# Patient Record
Sex: Female | Born: 1980 | Hispanic: No | Smoking: Current every day smoker
Health system: Southern US, Community
[De-identification: ages and names within clinical notes are randomized; demographics above are authoritative.]

## PROBLEM LIST (undated history)

## (undated) DIAGNOSIS — Z9189 Other specified personal risk factors, not elsewhere classified: Secondary | ICD-10-CM

## (undated) DIAGNOSIS — R51 Headache: Secondary | ICD-10-CM

## (undated) DIAGNOSIS — Q348 Other specified congenital malformations of respiratory system: Secondary | ICD-10-CM

## (undated) DIAGNOSIS — R519 Headache, unspecified: Secondary | ICD-10-CM

## (undated) DIAGNOSIS — K589 Irritable bowel syndrome without diarrhea: Secondary | ICD-10-CM

## (undated) DIAGNOSIS — N83209 Unspecified ovarian cyst, unspecified side: Secondary | ICD-10-CM

## (undated) DIAGNOSIS — N390 Urinary tract infection, site not specified: Secondary | ICD-10-CM

## (undated) DIAGNOSIS — K219 Gastro-esophageal reflux disease without esophagitis: Secondary | ICD-10-CM

## (undated) DIAGNOSIS — F329 Major depressive disorder, single episode, unspecified: Secondary | ICD-10-CM

## (undated) DIAGNOSIS — F32A Depression, unspecified: Secondary | ICD-10-CM

## (undated) DIAGNOSIS — B019 Varicella without complication: Secondary | ICD-10-CM

## (undated) DIAGNOSIS — I471 Supraventricular tachycardia, unspecified: Secondary | ICD-10-CM

## (undated) DIAGNOSIS — F509 Eating disorder, unspecified: Secondary | ICD-10-CM

## (undated) HISTORY — DX: Irritable bowel syndrome, unspecified: K58.9

## (undated) HISTORY — DX: Headache, unspecified: R51.9

## (undated) HISTORY — DX: Gastro-esophageal reflux disease without esophagitis: K21.9

## (undated) HISTORY — DX: Varicella without complication: B01.9

## (undated) HISTORY — DX: Depression, unspecified: F32.A

## (undated) HISTORY — DX: Major depressive disorder, single episode, unspecified: F32.9

## (undated) HISTORY — DX: Other specified personal risk factors, not elsewhere classified: Z91.89

## (undated) HISTORY — DX: Headache: R51

## (undated) HISTORY — DX: Other specified congenital malformations of respiratory system: Q34.8

## (undated) HISTORY — DX: Urinary tract infection, site not specified: N39.0

## (undated) HISTORY — DX: Unspecified ovarian cyst, unspecified side: N83.209

## (undated) HISTORY — DX: Eating disorder, unspecified: F50.9

---
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
## 2001-02-01 ENCOUNTER — Other Ambulatory Visit: Admission: RE | Admit: 2001-02-01 | Discharge: 2001-02-01 | Payer: Self-pay | Admitting: Obstetrics and Gynecology

## 2001-08-21 ENCOUNTER — Inpatient Hospital Stay (HOSPITAL_COMMUNITY): Admission: AD | Admit: 2001-08-21 | Discharge: 2001-08-23 | Payer: Self-pay | Admitting: Obstetrics & Gynecology

## 2002-12-31 ENCOUNTER — Emergency Department (HOSPITAL_COMMUNITY): Admission: EM | Admit: 2002-12-31 | Discharge: 2002-12-31 | Payer: Self-pay | Admitting: Emergency Medicine

## 2004-03-07 ENCOUNTER — Emergency Department (HOSPITAL_COMMUNITY): Admission: EM | Admit: 2004-03-07 | Discharge: 2004-03-08 | Payer: Self-pay | Admitting: Emergency Medicine

## 2004-04-18 ENCOUNTER — Emergency Department (HOSPITAL_COMMUNITY): Admission: EM | Admit: 2004-04-18 | Discharge: 2004-04-18 | Payer: Self-pay | Admitting: Emergency Medicine

## 2004-04-22 ENCOUNTER — Ambulatory Visit (HOSPITAL_COMMUNITY): Admission: RE | Admit: 2004-04-22 | Discharge: 2004-04-22 | Payer: Self-pay | Admitting: Obstetrics & Gynecology

## 2004-07-14 ENCOUNTER — Ambulatory Visit (HOSPITAL_COMMUNITY): Admission: RE | Admit: 2004-07-14 | Discharge: 2004-07-14 | Payer: Self-pay | Admitting: Obstetrics & Gynecology

## 2004-07-16 ENCOUNTER — Inpatient Hospital Stay (HOSPITAL_COMMUNITY): Admission: AD | Admit: 2004-07-16 | Discharge: 2004-07-20 | Payer: Self-pay | Admitting: Obstetrics & Gynecology

## 2004-09-11 ENCOUNTER — Inpatient Hospital Stay (HOSPITAL_COMMUNITY): Admission: AD | Admit: 2004-09-11 | Discharge: 2004-09-11 | Payer: Self-pay | Admitting: Obstetrics & Gynecology

## 2004-09-17 ENCOUNTER — Inpatient Hospital Stay (HOSPITAL_COMMUNITY): Admission: AD | Admit: 2004-09-17 | Discharge: 2004-09-19 | Payer: Self-pay | Admitting: Obstetrics

## 2006-08-07 ENCOUNTER — Emergency Department (HOSPITAL_COMMUNITY): Admission: EM | Admit: 2006-08-07 | Discharge: 2006-08-07 | Payer: Self-pay | Admitting: Emergency Medicine

## 2007-03-03 ENCOUNTER — Inpatient Hospital Stay (HOSPITAL_COMMUNITY): Admission: AD | Admit: 2007-03-03 | Discharge: 2007-03-09 | Payer: Self-pay | Admitting: *Deleted

## 2007-03-03 ENCOUNTER — Other Ambulatory Visit: Payer: Self-pay

## 2007-03-03 ENCOUNTER — Ambulatory Visit: Payer: Self-pay | Admitting: *Deleted

## 2007-11-24 ENCOUNTER — Encounter: Admission: RE | Admit: 2007-11-24 | Discharge: 2008-01-17 | Payer: Self-pay | Admitting: Urology

## 2008-06-03 ENCOUNTER — Emergency Department (HOSPITAL_COMMUNITY): Admission: EM | Admit: 2008-06-03 | Discharge: 2008-06-03 | Payer: Self-pay | Admitting: Emergency Medicine

## 2009-02-05 ENCOUNTER — Emergency Department (HOSPITAL_COMMUNITY): Admission: EM | Admit: 2009-02-05 | Discharge: 2009-02-05 | Payer: Self-pay | Admitting: Emergency Medicine

## 2009-03-27 ENCOUNTER — Emergency Department (HOSPITAL_COMMUNITY): Admission: EM | Admit: 2009-03-27 | Discharge: 2009-03-28 | Payer: Self-pay | Admitting: Emergency Medicine

## 2010-04-13 LAB — URINALYSIS, ROUTINE W REFLEX MICROSCOPIC
Glucose, UA: NEGATIVE mg/dL
Nitrite: POSITIVE — AB
Specific Gravity, Urine: 1.017 (ref 1.005–1.030)

## 2010-04-13 LAB — URINE CULTURE

## 2010-04-13 LAB — URINE MICROSCOPIC-ADD ON

## 2010-04-20 LAB — COMPREHENSIVE METABOLIC PANEL
AST: 29 U/L (ref 0–37)
Albumin: 4.2 g/dL (ref 3.5–5.2)
Calcium: 8.6 mg/dL (ref 8.4–10.5)
Chloride: 103 mEq/L (ref 96–112)
Total Bilirubin: 0.9 mg/dL (ref 0.3–1.2)

## 2010-04-20 LAB — CBC
HCT: 45.3 % (ref 36.0–46.0)
Hemoglobin: 14.9 g/dL (ref 12.0–15.0)
MCV: 95.3 fL (ref 78.0–100.0)
RBC: 4.75 MIL/uL (ref 3.87–5.11)
RDW: 13.1 % (ref 11.5–15.5)

## 2010-04-20 LAB — DIFFERENTIAL
Lymphocytes Relative: 19 % (ref 12–46)
Neutro Abs: 11.9 10*3/uL — ABNORMAL HIGH (ref 1.7–7.7)
Neutrophils Relative %: 76 % (ref 43–77)

## 2010-04-20 LAB — URINALYSIS, ROUTINE W REFLEX MICROSCOPIC
Hgb urine dipstick: NEGATIVE
Ketones, ur: NEGATIVE mg/dL
Urobilinogen, UA: 1 mg/dL (ref 0.0–1.0)
pH: 6.5 (ref 5.0–8.0)

## 2010-04-20 LAB — LIPASE, BLOOD: Lipase: 42 U/L (ref 11–59)

## 2010-05-06 LAB — CBC
Hemoglobin: 14.3 g/dL (ref 12.0–15.0)
MCHC: 34 g/dL (ref 30.0–36.0)
MCV: 94.3 fL (ref 78.0–100.0)
RBC: 4.44 MIL/uL (ref 3.87–5.11)
RDW: 13.5 % (ref 11.5–15.5)

## 2010-05-06 LAB — URINALYSIS, ROUTINE W REFLEX MICROSCOPIC
Glucose, UA: NEGATIVE mg/dL
Nitrite: NEGATIVE
Protein, ur: NEGATIVE mg/dL
Urobilinogen, UA: 0.2 mg/dL (ref 0.0–1.0)
pH: 7.5 (ref 5.0–8.0)

## 2010-05-06 LAB — POCT I-STAT, CHEM 8
Chloride: 107 mEq/L (ref 96–112)
HCT: 39 % (ref 36.0–46.0)
Sodium: 142 mEq/L (ref 135–145)

## 2010-05-06 LAB — DIFFERENTIAL
Basophils Absolute: 0 10*3/uL (ref 0.0–0.1)
Eosinophils Absolute: 0.2 10*3/uL (ref 0.0–0.7)
Eosinophils Relative: 2 % (ref 0–5)
Lymphocytes Relative: 39 % (ref 12–46)
Monocytes Relative: 8 % (ref 3–12)
Neutrophils Relative %: 51 % (ref 43–77)

## 2010-05-06 LAB — PREGNANCY, URINE: Preg Test, Ur: NEGATIVE

## 2010-06-10 NOTE — H&P (Signed)
[name], [name]                 ACCOUNT NO.:  [id number]   MEDICAL RECORD NO.:  [id number]          PATIENT TYPE:  IPS   LOCATION:  0305                          FACILITY:  [facility]   PHYSICIAN:  Jasmine Pang, M.D. DATE OF BIRTH:  [birth year]   DATE OF ADMISSION:  03/03/2007  DATE OF DISCHARGE:                       PSYCHIATRIC ADMISSION ASSESSMENT   A 30 year old female voluntarily admitted on March 03, 2007.   HISTORY OF PRESENT ILLNESS:  The patient presents with a history  depression and suicidal thoughts, states they were fleeting but intense,  experiencing tight sensations, felt like her life was leaving her.  She  was feeling very angry and crying, wanted to disappear.  She states  that her kids were not making her happy anymore and that became very  upsetting to her.  She called her psychologist who told that she was at  her breaking point and she encouraged her to be assessed for admission.  The patient states her stressors are that she lost her job in January.  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  Her husband is unable to get  disability for a post concussive syndrome.  She has been having trouble  sleeping during the night and has changes in her appetite.   PAST PSYCHIATRIC HISTORY:  First admission to [facility].  Has been on Prozac.  Was also on Paxil in the past but reports problems  with increased anxiety.  Was hospitalized as a child and was told at  that time she was bipolar and borderline personality.   SOCIAL HISTORY:  This is a 30 year old female.  She is married, has  three children ages 71, 65 and 2.  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  She reports increasing stress at that job.   FAMILY HISTORY:  Her sister with anxiety.  Her brother's on Zoloft.  Her  father has alcohol issues on his side of the family.   ALCOHOL AND DRUG HISTORY:  The patient has been doing some drinking at  times, one to two drinks, none  currently, and smokes marijuana.  States  she either drinks alcohol or smokes marijuana to help her cope.   PRIMARY CARE Malaquias Lenker:  Is Dr[facility] Jeannetta Nap at [facility].   MEDICAL PROBLEMS:  Was recently started on medications for IBS.  History  of interstitial cystitis and supraventricular tachycardia.  She reports  that she again recently got prescriptions that are at the pharmacy but  she has not picked them up yet and we will call [facility]  [place]   MEDICATIONS:  Has been on pindolol 10 mg, Prozac 30 daily.   DRUG ALLERGIES:  CLINDAMYCIN.   PHYSICAL EXAMINATION:  The patient was fully assessed at [facility]  emergency department.  Temperature is 96.7, 76 heart rate, 18  respirations, blood pressure is 107/72, 5 feet 2.5 inches tall, 113  pounds.  Urine drug screen was positive for THC.  Urine pregnancy test  is negative.  Alcohol level 5.  CMET within normal limits.  Wbc count is  12.5.   MENTAL STATUS EXAMINATION:  She is a fully alert, cooperative female,  good eye contact.  She is casually dressed.  Speech is clear, normal  pace and tone, articulate.  Mood is depressed.  The patient's affect she  appears sad, gets teary-eyed at times.  Thought processes are coherent.  No evidence of any delusional thinking.  Endorsing suicidal thoughts.  Cognitive function intact.  Her memory is good.  Judgment and insight is  fair.  She appears sincere.   AXIS I:  Mood disorder not otherwise specified, tetrahydrocannabinol  abuse.  AXIS II:  Deferred.  AXIS III:  Irritable bowel syndrome, interstitial cystitis and  supraventricular tachycardia.  AXIS IV:  Problems with occupation, psychosocial problems and possible  economic issues.  AXIS V:  Current is 35.   Plan to contract for safety.  Stabilize her mood and thinking.  Will  continue with her Prozac and add Depakote at bedtime for mood  stabilization.  We will also contact [facility] to  clarify  her medications and resume those.  Case manager will contact her husband  for a possible family session.  The patient will continue with her  therapy appointments.  Tentative length of stay is 3-5 days.      Landry Corporal, N.P.      Jasmine Pang, M.D.  Electronically Signed    JO/MEDQ  D:  03/04/2007  T:  03/05/2007  Job:  [id number]

## 2010-06-13 NOTE — Discharge Summary (Signed)
NAMEDEJANEE, [name]                 ACCOUNT NO.:  [id number]   MEDICAL RECORD NO.:  [id number]          PATIENT TYPE:  INP   LOCATION:  9155                          FACILITY:  [facility]   PHYSICIAN:  Charles A. Clearance Coots, M.D.DATE OF BIRTH:  02-Feb-1980   DATE OF ADMISSION:  07/16/2004  DATE OF DISCHARGE:  07/20/2004                                 DISCHARGE SUMMARY   ADMISSION DIAGNOSES:  1.  At [redacted] weeks gestation.  2.  Abdominal trauma from fall.  3.  Abdominal pain.   DISCHARGE DIAGNOSES:  1.  At [redacted] weeks gestation.  2.  Abdominal trauma from fall.  3.  Abdominal pain.  4.  Much improved after bed rest.  5.  No evidence of placental abruption.   REASON FOR ADMISSION:  A 30 year old white female G4, P2, estimated date of  confinement September 18, 2004, who presents to [facility] triage after  falling on the stairs at home and striking abdomen directly.  Complains of  abdominal pain.  The patient states that she was walking up the stairs and  tripped and hit her abdomen directly in the midline and had significant  abdominal soreness and pain after falling.  Denies vaginal bleeding.   PAST MEDICAL HISTORY:  Supraventricular tachycardia.   PAST SURGICAL HISTORY:  None.   MEDICATIONS:  1.  Penadol 5 mg daily.  2.  Prenatal vitamins.   ALLERGIES:  No known drug allergies.   SOCIAL HISTORY:  Married, negative history of alcohol or recreational drug  use.  Did have history of tobacco use, but states she quit when she found  out she was pregnant.   PHYSICAL EXAMINATION:  GENERAL APPEARANCE:  Well-developed, well-nourished  female in no acute distress.  VITAL SIGNS:  temperature 98.3, pulse 121, respirations 20, blood pressure  124/66.  LUNGS:  Clear to auscultation bilaterally.  HEART:  Regular rate and rhythm.  ABDOMEN:  Soft, gravid.  No focal tenderness.  Pelvic exam was omitted.  External fetal monitor is reassuring.  No contractions.   LABORATORY DATA:  Kleihauer Betke  was positive for fetal cells.  Platelets  were 92,000.  D. dimer was elevated at 1.18.  Fibrinogen was 323 mg/dL.  Hemoglobin 12.4.   HOSPITAL COURSE:  The patient was admitted and put on bed rest.  Repeat CBC  revealed platelets of 150,000.  Pain improved after 24 hours of bed rest and  continued to decrease.  By hospital day #3, she had no significant pain and  was discharged home on hospital day #5, much improved.   DISCHARGE MEDICATIONS:  Continue prenatal vitamins.   DISPOSITION:  The patient is to rest as much as possible at  home, and is to  be followed up in the office in one week.       CAH/MEDQ  D:  07/20/2004  T:  07/21/2004  Job:  [id number]
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
## 2010-10-17 LAB — DIFFERENTIAL
Basophils Absolute: 0
Basophils Relative: 0
Eosinophils Relative: 2
Lymphocytes Relative: 34
Monocytes Relative: 6
Neutro Abs: 7.4

## 2010-10-17 LAB — URINALYSIS, ROUTINE W REFLEX MICROSCOPIC
Bilirubin Urine: NEGATIVE
Ketones, ur: NEGATIVE
Protein, ur: 30 — AB
Urobilinogen, UA: 0.2

## 2010-10-17 LAB — CBC
HCT: 40.4
MCHC: 34.5
MCV: 90.8
Platelets: 217

## 2010-10-17 LAB — URINE MICROSCOPIC-ADD ON

## 2010-10-17 LAB — RAPID URINE DRUG SCREEN, HOSP PERFORMED
Opiates: NOT DETECTED
Tetrahydrocannabinol: POSITIVE — AB

## 2010-10-17 LAB — COMPREHENSIVE METABOLIC PANEL
ALT: 13
CO2: 27
Chloride: 104
GFR calc Af Amer: >60
Glucose, Bld: 75
Sodium: 139

## 2010-10-17 LAB — PREGNANCY, URINE: Preg Test, Ur: NEGATIVE

## 2014-07-18 ENCOUNTER — Emergency Department (HOSPITAL_COMMUNITY)
Admission: EM | Admit: 2014-07-18 | Discharge: 2014-07-19 | Disposition: A | Discharge status: Home / Self Care | Payer: Self-pay | Attending: Emergency Medicine | Admitting: Emergency Medicine

## 2014-07-18 ENCOUNTER — Encounter (HOSPITAL_COMMUNITY): Payer: Self-pay | Admitting: *Deleted

## 2014-07-18 ENCOUNTER — Emergency Department (HOSPITAL_COMMUNITY): Payer: Self-pay

## 2014-07-18 DIAGNOSIS — J029 Acute pharyngitis, unspecified: Secondary | ICD-10-CM | POA: Insufficient documentation

## 2014-07-18 DIAGNOSIS — E860 Dehydration: Secondary | ICD-10-CM | POA: Insufficient documentation

## 2014-07-18 DIAGNOSIS — K529 Noninfective gastroenteritis and colitis, unspecified: Secondary | ICD-10-CM | POA: Insufficient documentation

## 2014-07-18 DIAGNOSIS — Z8679 Personal history of other diseases of the circulatory system: Secondary | ICD-10-CM | POA: Insufficient documentation

## 2014-07-18 DIAGNOSIS — Z720 Tobacco use: Secondary | ICD-10-CM | POA: Insufficient documentation

## 2014-07-18 DIAGNOSIS — R109 Unspecified abdominal pain: Secondary | ICD-10-CM | POA: Insufficient documentation

## 2014-07-18 DIAGNOSIS — Z3202 Encounter for pregnancy test, result negative: Secondary | ICD-10-CM | POA: Insufficient documentation

## 2014-07-18 HISTORY — DX: Supraventricular tachycardia, unspecified: I47.10

## 2014-07-18 HISTORY — DX: Supraventricular tachycardia: I47.1

## 2014-07-18 LAB — URINALYSIS, ROUTINE W REFLEX MICROSCOPIC
Bilirubin Urine: NEGATIVE
Glucose, UA: NEGATIVE mg/dL
Hgb urine dipstick: NEGATIVE
Ketones, ur: NEGATIVE mg/dL
Leukocytes, UA: NEGATIVE
Nitrite: NEGATIVE
Protein, ur: NEGATIVE mg/dL
Specific Gravity, Urine: 1.015 (ref 1.005–1.030)
Urobilinogen, UA: 0.2 mg/dL (ref 0.0–1.0)
pH: 5.5 (ref 5.0–8.0)

## 2014-07-18 LAB — CBC WITH DIFFERENTIAL/PLATELET
BASOS ABS: 0 10*3/uL (ref 0.0–0.1)
BASOS PCT: 0 % (ref 0–1)
EOS PCT: 1 % (ref 0–5)
Eosinophils Absolute: 0.1 10*3/uL (ref 0.0–0.7)
HEMATOCRIT: 37.6 % (ref 36.0–46.0)
Hemoglobin: 12.9 g/dL (ref 12.0–15.0)
Lymphocytes Relative: 37 % (ref 12–46)
Lymphs Abs: 4 10*3/uL (ref 0.7–4.0)
MCH: 33.4 pg (ref 26.0–34.0)
MCHC: 34.3 g/dL (ref 30.0–36.0)
MCV: 97.4 fL (ref 78.0–100.0)
Monocytes Absolute: 0.6 10*3/uL (ref 0.1–1.0)
Monocytes Relative: 5 % (ref 3–12)
Neutro Abs: 6.2 10*3/uL (ref 1.7–7.7)
Neutrophils Relative %: 57 % (ref 43–77)
Platelets: 178 10*3/uL (ref 150–400)
RBC: 3.86 MIL/uL — ABNORMAL LOW (ref 3.87–5.11)
RDW: 12.8 % (ref 11.5–15.5)
WBC: 10.9 10*3/uL — ABNORMAL HIGH (ref 4.0–10.5)

## 2014-07-18 LAB — CK: Total CK: 114 U/L (ref 38–234)

## 2014-07-18 LAB — COMPREHENSIVE METABOLIC PANEL
ALK PHOS: 39 U/L (ref 38–126)
ALT: 22 U/L (ref 14–54)
AST: 30 U/L (ref 15–41)
Albumin: 3.7 g/dL (ref 3.5–5.0)
Anion gap: 8 (ref 5–15)
BILIRUBIN TOTAL: 0.5 mg/dL (ref 0.3–1.2)
BUN: <5 mg/dL — ABNORMAL LOW (ref 6–20)
CHLORIDE: 105 mmol/L (ref 101–111)
CO2: 25 mmol/L (ref 22–32)
CREATININE: 0.71 mg/dL (ref 0.44–1.00)
Calcium: 8.5 mg/dL — ABNORMAL LOW (ref 8.9–10.3)
GFR calc Af Amer: >60 mL/min (ref >60)
Glucose, Bld: 100 mg/dL — ABNORMAL HIGH (ref 65–99)
Potassium: 4.3 mmol/L (ref 3.5–5.1)
Sodium: 138 mmol/L (ref 135–145)
Total Protein: 6.5 g/dL (ref 6.5–8.1)

## 2014-07-18 LAB — LIPASE, BLOOD: LIPASE: 23 U/L (ref 22–51)

## 2014-07-18 LAB — POC URINE PREG, ED: Preg Test, Ur: NEGATIVE

## 2014-07-18 LAB — RAPID STREP SCREEN (MED CTR MEBANE ONLY): Streptococcus, Group A Screen (Direct): NEGATIVE

## 2014-07-18 MED ORDER — PROMETHAZINE HCL 25 MG/ML IJ SOLN
25.0000 mg | Freq: Once | INTRAMUSCULAR | Status: AC
  Administered 2014-07-18: 25 mg via INTRAVENOUS
  Filled 2014-07-18: qty 1

## 2014-07-18 MED ORDER — SODIUM CHLORIDE 0.9 % IV BOLUS (SEPSIS)
1000.0000 mL | Freq: Once | INTRAVENOUS | Status: AC
  Administered 2014-07-18: 1000 mL via INTRAVENOUS

## 2014-07-18 MED ORDER — IOHEXOL 300 MG/ML  SOLN
80.0000 mL | Freq: Once | INTRAMUSCULAR | Status: AC | PRN
  Administered 2014-07-18: 80 mL via INTRAVENOUS

## 2014-07-18 MED ORDER — MORPHINE SULFATE 4 MG/ML IJ SOLN
4.0000 mg | Freq: Once | INTRAMUSCULAR | Status: AC
  Administered 2014-07-18: 4 mg via INTRAVENOUS
  Filled 2014-07-18: qty 1

## 2014-07-18 MED ORDER — KETOROLAC TROMETHAMINE 30 MG/ML IJ SOLN
30.0000 mg | Freq: Once | INTRAMUSCULAR | Status: AC
  Administered 2014-07-18: 30 mg via INTRAVENOUS
  Filled 2014-07-18: qty 1

## 2014-07-18 MED ORDER — SODIUM CHLORIDE 0.9 % IV BOLUS (SEPSIS): 1000.0000 mL | Freq: Once | INTRAVENOUS | Status: DC

## 2014-07-18 NOTE — ED Notes (Signed)
Pt in c/o vomiting since Monday, also fatigue and muscle cramps, also hot and cold chills, pt alert and oriented, no distress noted
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
## 2014-07-18 NOTE — ED Notes (Signed)
Patient transported to CT 

## 2014-07-19 LAB — PREGNANCY, URINE: Preg Test, Ur: NEGATIVE

## 2014-07-19 MED ORDER — PROMETHAZINE HCL 25 MG PO TABS
25.0000 mg | ORAL_TABLET | Freq: Three times a day (TID) | ORAL | Status: DC | PRN
Start: 2014-07-19 — End: 2014-09-26

## 2014-07-19 MED ORDER — IBUPROFEN 800 MG PO TABS: 800.0000 mg | ORAL_TABLET | Freq: Three times a day (TID) | ORAL | Status: DC | PRN

## 2014-07-19 NOTE — ED Provider Notes (Signed)
CSN: [id number]     Arrival date & time 07/18/14  1846 History   First MD Initiated Contact with Patient 07/18/14 1923     Chief Complaint  Patient presents with  . Emesis     (Consider location/radiation/quality/duration/timing/severity/associated sxs/prior Treatment) HPI Patient presents to the emergency department with multiple complaints.  The patient states that starting last Thursday she has had fatigue, body aches, chills, vomiting and diarrhea.  The patient states that she has also had some mild sore throat.  The patient denies chest pain, shortness of breath, back pain, neck pain, fever, cough, runny nose, dysuria, hematuria, hematemesis, bloody stool, lightheadedness or syncope.  Patient states that she will generally dehydrated and she has been working outside multiple days.  The patient states nothing seems make her condition better or worse.  Patient did not take any medication prior to arrival Past Medical History  Diagnosis Date  . SVT (supraventricular tachycardia)    History reviewed. No pertinent past surgical history. History reviewed. No pertinent family history. History  Substance Use Topics  . Smoking status: Current Every Day Smoker  . Smokeless tobacco: Not on file  . Alcohol Use: Not on file   OB History    No data available     Review of Systems All other systems negative except as documented in the HPI. All pertinent positives and negatives as reviewed in the HPI.   Allergies  Clindamycin  Home Medications   Prior to Admission medications   Not on File   BP 113/72 mmHg  Pulse 88  Temp(Src) 98.4 F (36.9 C) (Oral)  Resp 12  SpO2 99% Physical Exam  Constitutional: She is oriented to person, place, and time. She appears well-developed and well-nourished. No distress.  HENT:  Head: Normocephalic and atraumatic.  Mouth/Throat: Oropharynx is clear and moist.  Eyes: Pupils are equal, round, and reactive to light.  Neck: Normal range of motion.  Neck supple.  Cardiovascular: Normal rate, regular rhythm and normal heart sounds.   Pulmonary/Chest: Effort normal and breath sounds normal. No respiratory distress.  Abdominal: Soft. Bowel sounds are normal. She exhibits no distension. There is tenderness. There is no rebound and no guarding.  Musculoskeletal: She exhibits no edema.  Neurological: She is alert and oriented to person, place, and time. She exhibits normal muscle tone. Coordination normal.  Skin: Skin is warm and dry. No rash noted. No erythema.  Psychiatric: She has a normal mood and affect. Her behavior is normal.  Nursing note and vitals reviewed.   ED Course  Procedures (including critical care time) Labs Review Labs Reviewed  CBC WITH DIFFERENTIAL/PLATELET - Abnormal; Notable for the following:    WBC 10.9 (*)    RBC 3.86 (*)    All other components within normal limits  COMPREHENSIVE METABOLIC PANEL - Abnormal; Notable for the following:    Glucose, Bld 100 (*)    BUN <5 (*)    Calcium 8.5 (*)    All other components within normal limits  RAPID STREP SCREEN (NOT AT [facility])  CULTURE, GROUP A STREP  LIPASE, BLOOD  URINALYSIS, ROUTINE W REFLEX MICROSCOPIC (NOT AT [facility])  CK  PREGNANCY, URINE  POC URINE PREG, ED    Imaging Review Ct Abdomen Pelvis W Contrast  07/18/2014   CLINICAL DATA:  Acute onset of left-sided abdominal pain, nausea, vomiting and diarrhea. Initial encounter.  EXAM: CT ABDOMEN AND PELVIS WITH CONTRAST  TECHNIQUE: Multidetector CT imaging of the abdomen and pelvis was performed using the standard  protocol following bolus administration of intravenous contrast.  CONTRAST:  80mL OMNIPAQUE IOHEXOL 300 MG/ML  SOLN  COMPARISON:  CT of the abdomen and pelvis performed 12/31/2002, and pelvic ultrasound performed 07/18/2004  FINDINGS: The visualized lung bases are clear.  The liver and spleen are unremarkable in appearance. The gallbladder is within normal limits. The pancreas and adrenal glands are  unremarkable.  The kidneys are unremarkable in appearance. There is no evidence of hydronephrosis. No renal or ureteral stones are seen. No perinephric stranding is appreciated.  No free fluid is identified. The small bowel is unremarkable in appearance. The stomach is within normal limits. No acute vascular abnormalities are seen.  The appendix is normal in caliber, without evidence of appendicitis. The colon is unremarkable in appearance.  The bladder is mildly distended and grossly unremarkable. The uterus unremarkable in appearance. An intrauterine device noted in expected position at the fundus of the uterus. The ovaries are relatively symmetric. No suspicious adnexal masses are seen. No inguinal lymphadenopathy is seen.  No acute osseous abnormalities are identified.  IMPRESSION: Unremarkable contrast-enhanced CT of the abdomen and pelvis.   Electronically Signed   By: Roanna Raider M.D.   On: 07/18/2014 23:08    The patient was given IV fluids here in the emergency department.  She is feeling better.  The patient is going to be treated for a viral gastroenteritis and dehydration.  She is told to return here as needed.  Her vital signs remained stable here in the emergency department.  The patient agrees to the plan and all questions were answered.  I did advise her to follow-up with a primary care doctor or an urgent care   Charlestine Night, PA-C 07/19/14 0013  Bethann Berkshire, MD 07/20/14 1520

## 2014-07-19 NOTE — Discharge Instructions (Signed)
Return here as needed.  Follow-up with an urgent care or primary care Dr. Jaquita Folds her fluid intake, rest as much as possible

## 2014-07-21 LAB — CULTURE, GROUP A STREP: Strep A Culture: NEGATIVE

## 2014-09-26 ENCOUNTER — Ambulatory Visit (INDEPENDENT_AMBULATORY_CARE_PROVIDER_SITE_OTHER): Payer: Self-pay | Admitting: Family Medicine

## 2014-09-26 ENCOUNTER — Ambulatory Visit (INDEPENDENT_AMBULATORY_CARE_PROVIDER_SITE_OTHER): Payer: Self-pay

## 2014-09-26 VITALS — BP 120/72 | HR 89 | Temp 99.4°F | Resp 18 | Ht 62.5 in | Wt 117.8 lb

## 2014-09-26 DIAGNOSIS — K5900 Constipation, unspecified: Secondary | ICD-10-CM

## 2014-09-26 DIAGNOSIS — N838 Other noninflammatory disorders of ovary, fallopian tube and broad ligament: Secondary | ICD-10-CM

## 2014-09-26 DIAGNOSIS — B3731 Acute candidiasis of vulva and vagina: Secondary | ICD-10-CM

## 2014-09-26 DIAGNOSIS — R1084 Generalized abdominal pain: Secondary | ICD-10-CM

## 2014-09-26 DIAGNOSIS — N949 Unspecified condition associated with female genital organs and menstrual cycle: Secondary | ICD-10-CM

## 2014-09-26 DIAGNOSIS — B373 Candidiasis of vulva and vagina: Secondary | ICD-10-CM

## 2014-09-26 DIAGNOSIS — N898 Other specified noninflammatory disorders of vagina: Secondary | ICD-10-CM

## 2014-09-26 DIAGNOSIS — R112 Nausea with vomiting, unspecified: Secondary | ICD-10-CM

## 2014-09-26 DIAGNOSIS — R102 Pelvic and perineal pain: Secondary | ICD-10-CM

## 2014-09-26 DIAGNOSIS — R197 Diarrhea, unspecified: Secondary | ICD-10-CM

## 2014-09-26 LAB — POCT UA - MICROSCOPIC ONLY
BACTERIA, U MICROSCOPIC: NEGATIVE
CRYSTALS, UR, HPF, POC: NEGATIVE
Casts, Ur, LPF, POC: NEGATIVE
EPITHELIAL CELLS, URINE PER MICROSCOPY: NEGATIVE
Mucus, UA: NEGATIVE
RBC, urine, microscopic: NEGATIVE
WBC, Ur, HPF, POC: NEGATIVE
Yeast, UA: NEGATIVE

## 2014-09-26 LAB — POCT CBC
Granulocyte percent: 55.1 %G (ref 37–80)
HCT, POC: 43.3 % (ref 37.7–47.9)
HEMOGLOBIN: 13.9 g/dL (ref 12.2–16.2)
Lymph, poc: 4.7 — AB (ref 0.6–3.4)
MCH: 31.2 pg (ref 27–31.2)
MCHC: 32.2 g/dL (ref 31.8–35.4)
MCV: 97 fL (ref 80–97)
MID (CBC): 0.8 (ref 0–0.9)
MPV: 6.2 fL (ref 0–99.8)
PLATELET COUNT, POC: 264 10*3/uL (ref 142–424)
POC Granulocyte: 6.7 (ref 2–6.9)
POC LYMPH PERCENT: 38.3 %L (ref 10–50)
POC MID %: 6.6 %M (ref 0–12)
RBC: 4.46 M/uL (ref 4.04–5.48)
RDW, POC: 14.3 %
WBC: 12.2 10*3/uL — AB (ref 4.6–10.2)

## 2014-09-26 LAB — POCT URINALYSIS DIPSTICK
Bilirubin, UA: NEGATIVE
Blood, UA: NEGATIVE
GLUCOSE UA: NEGATIVE
Ketones, UA: NEGATIVE
Leukocytes, UA: NEGATIVE
NITRITE UA: NEGATIVE
Protein, UA: NEGATIVE
Spec Grav, UA: 1.02
Urobilinogen, UA: 0.2
pH, UA: 6

## 2014-09-26 LAB — POCT WET PREP WITH KOH
KOH PREP POC: POSITIVE
RBC Wet Prep HPF POC: NEGATIVE
TRICHOMONAS UA: NEGATIVE
YEAST WET PREP PER HPF POC: POSITIVE

## 2014-09-26 LAB — IFOBT (OCCULT BLOOD): IFOBT: NEGATIVE

## 2014-09-26 LAB — POCT URINE PREGNANCY: Preg Test, Ur: NEGATIVE

## 2014-09-26 MED ORDER — FLUCONAZOLE 150 MG PO TABS: 150.0000 mg | ORAL_TABLET | Freq: Once | ORAL | Status: DC

## 2014-09-26 MED ORDER — OXYCODONE-ACETAMINOPHEN 5-325 MG PO TABS: 1.0000 | ORAL_TABLET | Freq: Three times a day (TID) | ORAL | Status: DC | PRN

## 2014-09-26 MED ORDER — PROMETHAZINE HCL 25 MG PO TABS: 25.0000 mg | ORAL_TABLET | Freq: Three times a day (TID) | ORAL | Status: DC | PRN

## 2014-09-26 NOTE — Progress Notes (Addendum)
Subjective:  This chart was scribed for Norberto Sorenson, MD by [facility], medical scribe at [facility].The patient was seen in exam room 03 and the patient's care was started at 6:43 PM.   Patient ID: [name], female    DOB: [birth year], 34 y.o.   MRN: [id number] Chief Complaint  Patient presents with  . Diarrhea    x 3 weeks   . Abdominal Pain    left side, stomach pain when eating, bright yellow   . Generalized Body Aches  . Nausea  . Contraception    IUD is in and expired 6-8 months ago. pt does not have insurance to have it removed.    HPI HPI Comments: [name] is a 34 y.o. female who presents to [facility] complaining of worsening diarrhea, nausea, dry heaving, and abdominal pain. Tried several diet changes in the past which has not helped. Taking ibuprofen, recently every 8 hours. Percocet as well. Appointment with GI September 9th.Pt has had chronic GI issues. She was seen in the ER two months ago with dehydration and viral gastro. Complained of Fatigue, body ache, vomiting, diarrhea. Neg pregnancy, and strep, normal urine that did not show any dehydration. Normal CBC, CMP, lipase, and CK. Calcium was slightly low at 8.5.   She has had the abdominal pain on the left side for 3d.  Pt would like her IUD removed. Currently using vaginal film contraceptives, does not have menstrual periods since IUD. White vaginal discharge in the past day or two. No irration. Urinary frequency concerned about dehydration. Intermittent lightheaded, weakness, dizzy spells.  Past Medical History  Diagnosis Date  . SVT (supraventricular tachycardia)    Current Outpatient Prescriptions on File Prior to Visit  Medication Sig Dispense Refill  . ibuprofen (ADVIL,MOTRIN) 800 MG tablet Take 1 tablet (800 mg total) by mouth every 8 (eight) hours as needed. 21 tablet 0  . promethazine (PHENERGAN) 25 MG tablet Take 1 tablet (25 mg total) by mouth every 8  (eight) hours as needed for nausea or vomiting. (Patient not taking: Reported on 09/26/2014) 15 tablet 0   No current facility-administered medications on file prior to visit.   Allergies  Allergen Reactions  . Clindamycin     Hives/ Swelling    Review of Systems  Gastrointestinal: Positive for nausea, abdominal pain, diarrhea and constipation.  Genitourinary: Positive for frequency and vaginal discharge.  Musculoskeletal: Positive for myalgias.  Neurological: Positive for dizziness, weakness and light-headedness.      Objective:  BP 120/72 mmHg  Pulse 89  Temp(Src) 99.4 F (37.4 C) (Oral)  Resp 18  Ht 5' 2.5" (1.588 m)  Wt 117 lb 12.8 oz (53.434 kg)  BMI 21.19 kg/m2  SpO2 99% Physical Exam  Constitutional: She is oriented to person, place, and time. She appears well-developed and well-nourished. No distress.  HENT:  Head: Normocephalic and atraumatic.  Eyes: Pupils are equal, round, and reactive to light.  Neck: Normal range of motion.  Cardiovascular: Normal rate and regular rhythm.   Pulmonary/Chest: Effort normal. No respiratory distress.  Abdominal: Normal appearance. She exhibits mass. She exhibits no distension and no pulsatile midline mass. Bowel sounds are increased. There is no hepatosplenomegaly. There is tenderness in the left lower quadrant. There is no rigidity, no rebound, no guarding, no CVA tenderness, no tenderness at McBurney's point and negative Murphy's sign. No hernia.  Genitourinary:  Normal labia minor and majora. Small skin tags likely from  prior vagina births. Vagina normal. Cervix has multiple nabothian cysts. Mild amount of yellow mucoid discharge and mild erythema around external OS. IUD strings visualized and easily removed with forceps. Uterus normal to palpation. Left adnexa with palpable ovary.  Musculoskeletal: Normal range of motion.  Lymphadenopathy:       Right: No inguinal adenopathy present.       Left: No inguinal adenopathy present.    Neurological: She is alert and oriented to person, place, and time.  Skin: Skin is warm and dry.  Psychiatric: She has a normal mood and affect. Her behavior is normal.  Nursing note and vitals reviewed.     Results for orders placed or performed in visit on 09/26/14  POCT UA - Microscopic Only  Result Value Ref Range   WBC, Ur, HPF, POC neg    RBC, urine, microscopic neg    Bacteria, U Microscopic neg    Mucus, UA neg    Epithelial cells, urine per micros neg    Crystals, Ur, HPF, POC neg    Casts, Ur, LPF, POC neg    Yeast, UA neg   POCT urinalysis dipstick  Result Value Ref Range   Color, UA yellow    Clarity, UA clear    Glucose, UA neg    Bilirubin, UA neg    Ketones, UA neg    Spec Grav, UA 1.020    Blood, UA neg    pH, UA 6.0    Protein, UA neg    Urobilinogen, UA 0.2    Nitrite, UA neg    Leukocytes, UA Negative Negative  POCT urine pregnancy  Result Value Ref Range   Preg Test, Ur Negative Negative  POCT Wet Prep with KOH  Result Value Ref Range   Trichomonas, UA Negative    Clue Cells Wet Prep HPF POC 0-4    Epithelial Wet Prep HPF POC Many Few, Moderate, Many   Yeast Wet Prep HPF POC positive    Bacteria Wet Prep HPF POC Many (A) None, Few   RBC Wet Prep HPF POC neg    WBC Wet Prep HPF POC 15-20    KOH Prep POC Positive   POCT CBC  Result Value Ref Range   WBC 12.2 (A) 4.6 - 10.2 K/uL   Lymph, poc 4.7 (A) 0.6 - 3.4   POC LYMPH PERCENT 38.3 10 - 50 %L   MID (cbc) 0.8 0 - 0.9   POC MID % 6.6 0 - 12 %M   POC Granulocyte 6.7 2 - 6.9   Granulocyte percent 55.1 37 - 80 %G   RBC 4.46 4.04 - 5.48 M/uL   Hemoglobin 13.9 12.2 - 16.2 g/dL   HCT, POC 16.1 09.6 - 47.9 %   MCV 97.0 80 - 97 fL   MCH, POC 31.2 27 - 31.2 pg   MCHC 32.2 31.8 - 35.4 g/dL   RDW, POC 04.5 %   Platelet Count, POC 264 142 - 424 K/uL   MPV 6.2 0 - 99.8 fL  IFOBT POC (occult bld, rslt in office)  Result Value Ref Range   IFOBT Negative    [facility] reading (PRIMARY) by  Dr. Clelia Croft. 2  view abd xray: no acute abnormality  Dg Abd 2 Views  09/26/2014   CLINICAL DATA:  Abdominal pain, nausea, diarrhea  EXAM: ABDOMEN - 2 VIEW  COMPARISON:  None.  FINDINGS: The bowel gas pattern is normal. There is no evidence of free air. No radio-opaque calculi or other  significant radiographic abnormality is seen.  IMPRESSION: Negative.   Electronically Signed   By: Elige Ko   On: 09/26/2014 21:07    Assessment & Plan:   1. Diarrhea   2. Vaginal discharge   3. Non-intractable vomiting with nausea, vomiting of unspecified type   4. Generalized abdominal pain   5. Ovarian enlargement, left   6. Constipation, unspecified constipation type   Pt w/ h/o chronic abd pain with alternating constipation/diarrhea. She has been clinically diagnosed with IBS sev times prior after sxs did not respond to elimination diets with either lactose or gluten but she has never seen a GI doctor or had a colonoscopy. Has appt to sched w/ new PCP in  [place] in 10d so will proceed w/ [place] [facility] referral as well.  Currently suspect ovarian cyst but cannot exclude ovarian malignancy, torsion, PID, ectopic.  Pain has been present for 3d now and has not worsened over the past 24 hrs. She was able to go to a job interview today at her current level of pain. Pain is severe but she does not have any peritoneal or concerns for an acute surgical abd at this time but offered to send pt to ER tonight for stat abd/pelvic CT - pt declines since pain is similar to prior ovarian cyst but understands that torsion which is a surgical emergency cannot be completely excluded - is aware of risks of waiting for imaging and agrees to call 911 and proceed to ER o/n if any peritoneal signs develop.  In the mean, pt will use prn percocet and phenergan to which she has responded well prev.   Also, no need to treat with IM antibitoic therapy tonight - leukocytosis and mild and diff supports it being reactive rather than true infection.  Pt does  not have any signs of cervicitis or endometritis on exam. Old IUD was removed w/o difficulty and it appeared normal - no bleeding, purulent discharge, odor, or tenderness so PID very unlikely but will check uriprobe in case.  Neg UPT and using contraception of "vaginal films" religiously so highly unlikely to be ectopic  Pt does not have health insurance - is paying for all out of pocket.   Orders Placed This Encounter  Procedures  . GC/Chlamydia Probe Amp  . DG Abd 2 Views    Standing Status: Future     Number of Occurrences: 1     Standing Expiration Date: 09/26/2015    Order Specific Question:  Reason for Exam (SYMPTOM  OR DIAGNOSIS REQUIRED)    Answer:  constipaiton/diarrhea, vomiting. bowels hyperactive, left lower quad pain    Order Specific Question:  Is the patient pregnant?    Answer:  No    Order Specific Question:  Preferred imaging location?    Answer:  External  . Ambulatory referral to Gastroenterology    Referral Priority:  Routine    Referral Type:  Consultation    Referral Reason:  Specialty Services Required    Number of Visits Requested:  1  . POCT UA - Microscopic Only  . POCT urinalysis dipstick  . POCT urine pregnancy  . POCT Wet Prep with KOH  . POCT CBC  . IFOBT POC (occult bld, rslt in office)    Meds ordered this encounter  Medications  . promethazine (PHENERGAN) 25 MG tablet    Sig: Take 1 tablet (25 mg total) by mouth every 8 (eight) hours as needed for nausea or vomiting.    Dispense:  60 tablet  Refill:  0  . oxyCODONE-acetaminophen (ROXICET) 5-325 MG per tablet    Sig: Take 1 tablet by mouth every 8 (eight) hours as needed for severe pain.    Dispense:  20 tablet    Refill:  0  . fluconazole (DIFLUCAN) 150 MG tablet    Sig: Take 1 tablet (150 mg total) by mouth once. Repeat if needed after 3 days    Dispense:  1 tablet    Refill:  1    I personally performed the services described in this documentation, which was scribed in my presence.  The recorded information has been reviewed and considered, and addended by me as needed.  Norberto Sorenson, MD MPH

## 2014-09-26 NOTE — Patient Instructions (Signed)
Ovarian Cyst An ovarian cyst is a fluid-filled sac that forms on an ovary. The ovaries are small organs that produce eggs in women. Various types of cysts can form on the ovaries. Most are not cancerous. Many do not cause problems, and they often go away on their own. Some may cause symptoms and require treatment. Common types of ovarian cysts include:  Functional cysts--These cysts may occur every month during the menstrual cycle. This is normal. The cysts usually go away with the next menstrual cycle if the woman does not get pregnant. Usually, there are no symptoms with a functional cyst.  Endometrioma cysts--These cysts form from the tissue that lines the uterus. They are also called "chocolate cysts" because they become filled with blood that turns brown. This type of cyst can cause pain in the lower abdomen during intercourse and with your menstrual period.  Cystadenoma cysts--This type develops from the cells on the outside of the ovary. These cysts can get very big and cause lower abdomen pain and pain with intercourse. This type of cyst can twist on itself, cut off its blood supply, and cause severe pain. It can also easily rupture and cause a lot of pain.  Dermoid cysts--This type of cyst is sometimes found in both ovaries. These cysts may contain different kinds of body tissue, such as skin, teeth, hair, or cartilage. They usually do not cause symptoms unless they get very big.  Theca lutein cysts--These cysts occur when too much of a certain hormone (human chorionic gonadotropin) is produced and overstimulates the ovaries to produce an egg. This is most common after procedures used to assist with the conception of a baby (in vitro fertilization). CAUSES   Fertility drugs can cause a condition in which multiple large cysts are formed on the ovaries. This is called ovarian hyperstimulation syndrome.  A condition called polycystic ovary syndrome can cause hormonal imbalances that can lead to  nonfunctional ovarian cysts. SIGNS AND SYMPTOMS  Many ovarian cysts do not cause symptoms. If symptoms are present, they may include:  Pelvic pain or pressure.  Pain in the lower abdomen.  Pain during sexual intercourse.  Increasing girth (swelling) of the abdomen.  Abnormal menstrual periods.  Increasing pain with menstrual periods.  Stopping having menstrual periods without being pregnant. DIAGNOSIS  These cysts are commonly found during a routine or annual pelvic exam. Tests may be ordered to find out more about the cyst. These tests may include:  Ultrasound.  X-ray of the pelvis.  CT scan.  MRI.  Blood tests. TREATMENT  Many ovarian cysts go away on their own without treatment. Your health care provider may want to check your cyst regularly for 2-3 months to see if it changes. For women in menopause, it is particularly important to monitor a cyst closely because of the higher rate of ovarian cancer in menopausal women. When treatment is needed, it may include any of the following:  A procedure to drain the cyst (aspiration). This may be done using a long needle and ultrasound. It can also be done through a laparoscopic procedure. This involves using a thin, lighted tube with a tiny camera on the end (laparoscope) inserted through a small incision.  Surgery to remove the whole cyst. This may be done using laparoscopic surgery or an open surgery involving a larger incision in the lower abdomen.  Hormone treatment or birth control pills. These methods are sometimes used to help dissolve a cyst. HOME CARE INSTRUCTIONS   Only take over-the-counter   or prescription medicines as directed by your health care provider.  Follow up with your health care provider as directed.  Get regular pelvic exams and Pap tests. SEEK MEDICAL CARE IF:   Your periods are late, irregular, or painful, or they stop.  Your pelvic pain or abdominal pain does not go away.  Your abdomen becomes  larger or swollen.  You have pressure on your bladder or trouble emptying your bladder completely.  You have pain during sexual intercourse.  You have feelings of fullness, pressure, or discomfort in your stomach.  You lose weight for no apparent reason.  You feel generally ill.  You become constipated.  You lose your appetite.  You develop acne.  You have an increase in body and facial hair.  You are gaining weight, without changing your exercise and eating habits.  You think you are pregnant. SEEK IMMEDIATE MEDICAL CARE IF:   You have increasing abdominal pain.  You feel sick to your stomach (nauseous), and you throw up (vomit).  You develop a fever that comes on suddenly.  You have abdominal pain during a bowel movement.  Your menstrual periods become heavier than usual. MAKE SURE YOU:  Understand these instructions.  Will watch your condition.  Will get help right away if you are not doing well or get worse. Document Released: 01/12/2005 Document Revised: 01/17/2013 Document Reviewed: 09/19/2012 [facility] Patient Information 2015 [place], [place]. This information is not intended to replace advice given to you by your health care provider. Make sure you discuss any questions you have with your health care provider. Ovarian Torsion The ovaries are female reproductive organs that produce eggs. Ovarian torsion is when an ovary becomes twisted and cuts off its own blood supply. This can occur at any age. If an ovary is twisted, it cannot get blood and the ovary swells. It is a painful medical emergency. It must be treated quickly. If too much time has passed, blood flow to the ovary may not be restored and the ovary may have to be removed. CAUSES Torsion can happen in an ovary that is normal size. However, most of the time it occurs in an ovary that is enlarged. An ovary can become enlarged because of:  Harmless (benign) tumors on the ovaries.  Cancerous  tumors.  Ovarian cysts, which are fluid-filled sacs.  Normal pregnancy.  A pregnancy that occurs outside the uterus (ectopic pregnancy). RISK FACTORS Risk factors are things that increase the likelihood of this condition happening. The risk factors include:  Having fallopian tubes that are longer than normal.  Having ovaries that are larger than normal.  Taking fertility medicine to become pregnant.  Having had surgery in the pelvic area. SYMPTOMS  Sudden pain in the lower abdomen, usually on one side only.  Pelvic pain that starts after exercise.  Pelvic pain that gets worse over time.  Severe pelvic pain that comes and goes.  Pelvic pain that spreads into the lower back or thigh.  Nausea and vomiting along with pelvic pain. DIAGNOSIS Your caregiver will take a history and perform a physical exam. He or she may be able to feel an enlarged ovary. Your caregiver may order some further tests, which include:  A pregnancy test.  Imaging tests, such as pelvic Doppler ultrasonography, that measures blood flow, CT scan, or MRI. Your caregiver may also perform a diagnostic laparoscopic exam. A small surgical cut (incision) will be made in your abdomen. Then, a small lighted telescope is put through the opening. This  allows your caregiver to clearly see your ovary and fallopian tube.  TREATMENT Surgery is needed when an ovary becomes twisted. It is best to do this 8 hours or less after the ovary becomes twisted. Laparoscopic ovarian torsion surgery is done to try to untwist the ovary. Sometimes, a large incision has to be made in the abdomen (laparotomy) to relieve the ovary. If the ovary cannot be untwisted, the ovary will have to be surgically removed during a procedure called salpingo-oophorectomy. Document Released: 01/01/2011 Document Revised: 05/29/2013 Document Reviewed: 01/01/2011 [facility] Patient Information 2015 [place], [place]. This information is not intended to replace  advice given to you by your health care provider. Make sure you discuss any questions you have with your health care provider.

## 2014-09-27 ENCOUNTER — Ambulatory Visit (HOSPITAL_COMMUNITY)
Admission: RE | Admit: 2014-09-27 | Discharge: 2014-09-27 | Disposition: A | Discharge status: Home / Self Care | Payer: Self-pay | Source: Ambulatory Visit | Attending: Family Medicine | Admitting: Family Medicine

## 2014-09-27 ENCOUNTER — Other Ambulatory Visit: Payer: Self-pay | Admitting: Family Medicine

## 2014-09-27 DIAGNOSIS — N838 Other noninflammatory disorders of ovary, fallopian tube and broad ligament: Secondary | ICD-10-CM

## 2014-09-27 DIAGNOSIS — N831 Corpus luteum cyst: Secondary | ICD-10-CM | POA: Insufficient documentation

## 2014-09-27 DIAGNOSIS — N898 Other specified noninflammatory disorders of vagina: Secondary | ICD-10-CM | POA: Insufficient documentation

## 2014-09-27 DIAGNOSIS — R1032 Left lower quadrant pain: Secondary | ICD-10-CM | POA: Insufficient documentation

## 2014-09-27 DIAGNOSIS — R1084 Generalized abdominal pain: Secondary | ICD-10-CM

## 2014-09-27 DIAGNOSIS — R102 Pelvic and perineal pain: Secondary | ICD-10-CM

## 2014-09-27 MED ORDER — DOXYCYCLINE HYCLATE 100 MG PO CAPS: 100.0000 mg | ORAL_CAPSULE | Freq: Two times a day (BID) | ORAL | Status: DC

## 2014-09-27 NOTE — Addendum Note (Signed)
Addended by: Eddie Candle on: 09/27/2014 08:53 AM   Modules accepted: Orders

## 2014-09-27 NOTE — Addendum Note (Signed)
Addended by: Norberto Sorenson on: 09/27/2014 06:33 PM   Modules accepted: Orders

## 2014-09-28 LAB — GC/CHLAMYDIA PROBE AMP
CT Probe RNA: NEGATIVE
GC PROBE AMP APTIMA: NEGATIVE

## 2014-10-05 ENCOUNTER — Encounter: Payer: Self-pay | Admitting: Family

## 2014-10-05 ENCOUNTER — Ambulatory Visit (INDEPENDENT_AMBULATORY_CARE_PROVIDER_SITE_OTHER): Payer: Self-pay | Admitting: Family

## 2014-10-05 VITALS — BP 100/72 | HR 88 | Temp 98.4°F | Resp 18 | Ht 62.5 in | Wt 118.0 lb

## 2014-10-05 DIAGNOSIS — R1032 Left lower quadrant pain: Secondary | ICD-10-CM | POA: Insufficient documentation

## 2014-10-05 MED ORDER — ELUXADOLINE 100 MG PO TABS: 100.0000 mg | ORAL_TABLET | Freq: Two times a day (BID) | ORAL | Status: DC

## 2014-10-05 NOTE — Progress Notes (Signed)
Subjective:    Patient ID: [name], female    DOB: 1980/06/02, 34 y.o.   MRN: [id number]  Chief Complaint  Patient presents with  . Establish Care    stilld does have abdominal pain that goes to her back, described as a dull constant pain with increased movement, states she had ovarian cysts that ruptured along with pelvic infection, could not finish antibiotic due to reaction, does not have GYN doc, has had severe diarrhea for a month, does have GI appt set up for 9/29    HPI:  [name] is a 34 y.o. female who  has a past medical history of SVT (supraventricular tachycardia); Depression; GERD (gastroesophageal reflux disease); Eating disorder; Frequent headaches; History of fainting spells of unknown cause; Chicken pox; UTI (lower urinary tract infection); IBS (irritable bowel syndrome); and Ovarian cyst. who presents today for an office visit to establish care.   Recently seen in [facility] for diarrhea, vaginal discharge, generalized abdominal pain, ovarian enlargement, left adnexal tenderness and vaginal candidiasis. GC probe was negative and the wet prep was positive for yeast and had many bacteria noted. A transvaginal ultrasound was also completed and showed no evidence for her left sided abdominal pain but did show a right ovarian lesion measuring 1.8 cm. She was treated with phenergan, oxycodone, fluconazole and doxycycline. She has a scheduled GI appointment on 9/29.  Continues to experience the associated symptom of pain located between the lower left quadrant and suprapubic region that has been going on total for about 7 years since she had her children. Describes the pain as dull and constant and also sharp and stabbing on occasion. Indicates it felt like "a steel rebarb going through the area." Aggravating factors include discomfort with bowel movements. Severity of the pain is is rated a 4/10. Does note that she is having loose stools and which are different  colors including orange, brown and occasionally dark. Frequency of stools is 2-7 per day depending upon the day. Modifying factors include the oxycodone and antibiotics. She did have a reaction to the antibiotic.   Allergies  Allergen Reactions  . Clindamycin     Hives/ Swelling   . Doxycycline Itching    Nausea and vomitting     Outpatient Prescriptions Prior to Visit  Medication Sig Dispense Refill  . ibuprofen (ADVIL,MOTRIN) 800 MG tablet Take 1 tablet (800 mg total) by mouth every 8 (eight) hours as needed. 21 tablet 0  . oxyCODONE-acetaminophen (ROXICET) 5-325 MG per tablet Take 1 tablet by mouth every 8 (eight) hours as needed for severe pain. 20 tablet 0  . promethazine (PHENERGAN) 25 MG tablet Take 1 tablet (25 mg total) by mouth every 8 (eight) hours as needed for nausea or vomiting. 60 tablet 0  . doxycycline (VIBRAMYCIN) 100 MG capsule Take 1 capsule (100 mg total) by mouth 2 (two) times daily. 28 capsule 0  . fluconazole (DIFLUCAN) 150 MG tablet Take 1 tablet (150 mg total) by mouth once. Repeat if needed after 3 days 1 tablet 1   No facility-administered medications prior to visit.     Past Medical History  Diagnosis Date  . SVT (supraventricular tachycardia)   . Depression   . GERD (gastroesophageal reflux disease)   . Eating disorder   . Frequent headaches   . History of fainting spells of unknown cause   . Chicken pox   . UTI (lower urinary tract infection)   . IBS (irritable bowel syndrome)   .  Ovarian cyst      No past surgical history on file.   Family History  Problem Relation Age of Onset  . Hyperlipidemia Mother   . Hypertension Mother   . Bipolar disorder Mother   . Hyperlipidemia Father   . Hypertension Father   . Arthritis Maternal Grandmother   . Arthritis Maternal Grandfather   . Arthritis Paternal Grandmother   . Arthritis Paternal Grandfather      Social History   Social History  . Marital Status: Divorced    Spouse Name: N/A  .  Number of Children: 3  . Years of Education: 14   Occupational History  . Marketing    Social History Main Topics  . Smoking status: Current Every Day Smoker -- 1.00 packs/day for 21 years    Types: Cigarettes  . Smokeless tobacco: Never Used  . Alcohol Use: 3.6 oz/week    2 Glasses of wine, 2 Cans of beer, 2 Shots of liquor per week  . Drug Use: Yes    Special: Marijuana  . Sexual Activity: Yes    Birth Control/ Protection: None, Condom   Other Topics Concern  . Not on file   Social History Narrative   Fun: Clinical cytogeneticist, work, Sports administrator   Denies religious beliefs effecting health care   Denies abuse and feels safe at home.     Review of Systems  Constitutional: Negative for fever and chills.  Gastrointestinal: Positive for abdominal pain and diarrhea. Negative for vomiting.  Genitourinary: Negative for dysuria, urgency, frequency, hematuria, flank pain and vaginal discharge.      Objective:    BP 100/72 mmHg  Pulse 88  Temp(Src) 98.4 F (36.9 C) (Oral)  Resp 18  Ht 5' 2.5" (1.588 m)  Wt 118 lb (53.524 kg)  BMI 21.22 kg/m2  SpO2 96% Nursing note and vital signs reviewed.  Physical Exam  Constitutional: She is oriented to person, place, and time. She appears well-developed and well-nourished. No distress.  Cardiovascular: Normal rate, regular rhythm, normal heart sounds and intact distal pulses.   Pulmonary/Chest: Effort normal and breath sounds normal.  Abdominal: Soft. Normal appearance and bowel sounds are normal. She exhibits no mass. There is no hepatosplenomegaly. There is tenderness in the suprapubic area and left lower quadrant. There is no rigidity, no rebound, no guarding, no tenderness at McBurney's point and negative Murphy's sign.  Neurological: She is alert and oriented to person, place, and time.  Skin: Skin is warm and dry.  Psychiatric: She has a normal mood and affect. Her behavior is normal. Judgment and thought content normal.       Assessment &  Plan:   Problem List Items Addressed This Visit      Other   Abdominal pain, left lower quadrant - Primary    Long standing diarreha and abdominal pain consistent with possible  IBS-D or IBD. Ultrasound was negative for ovarian cyst. Start Viberzi in attempts to decrease diarreha. Recommend Activia or other probiotic daily. Unlikely diverticulitis based on length of symptoms.  Follow up with GI as scheduled for possible colonoscopy and additional assessment.       Relevant Medications   Eluxadoline (VIBERZI) 100 MG TABS

## 2014-10-05 NOTE — Patient Instructions (Signed)
Thank you for choosing [facility].  Summary/Instructions:  Your prescription(s) have been submitted to your pharmacy or been printed and provided for you. Please take as directed and contact our office if you believe you are having problem(s) with the medication(s) or have any questions.  If your symptoms worsen or fail to improve, please contact our office for further instruction, or in case of emergency go directly to the emergency room at the closest medical facility.   Irritable Bowel Syndrome Irritable bowel syndrome (IBS) is caused by a disturbance of normal bowel function and is a common digestive disorder. You may also hear this condition called spastic colon, mucous colitis, and irritable colon. There is no cure for IBS. However, symptoms often gradually improve or disappear with a good diet, stress management, and medicine. This condition usually appears in late adolescence or early adulthood. Women develop it twice as often as men. CAUSES  After food has been digested and absorbed in the small intestine, waste material is moved into the large intestine, or colon. In the colon, water and salts are absorbed from the undigested products coming from the small intestine. The remaining residue, or fecal material, is held for elimination. Under normal circumstances, gentle, rhythmic contractions of the bowel walls push the fecal material along the colon toward the rectum. In IBS, however, these contractions are irregular and poorly coordinated. The fecal material is either retained too long, resulting in constipation, or expelled too soon, producing diarrhea. SIGNS AND SYMPTOMS  The most common symptom of IBS is abdominal pain. It is often in the lower left side of the abdomen, but it may occur anywhere in the abdomen. The pain comes from spasms of the bowel muscles happening too much and from the buildup of gas and fecal material in the colon. This pain:  Can range from sharp abdominal  cramps to a dull, continuous ache.  Often worsens soon after eating.  Is often relieved by having a bowel movement or passing gas. Abdominal pain is usually accompanied by constipation, but it may also produce diarrhea. The diarrhea often occurs right after a meal or upon waking up in the morning. The stools are often soft, watery, and flecked with mucus. Other symptoms of IBS include:  Bloating.  Loss of appetite.  Heartburn.  Backache.  Dull pain in the arms or shoulders.  Nausea.  Burping.  Vomiting.  Gas. IBS may also cause symptoms that are unrelated to the digestive system, such as:  Fatigue.  Headaches.  Anxiety.  Shortness of breath.  Trouble concentrating.  Dizziness. These symptoms tend to come and go. DIAGNOSIS  The symptoms of IBS may seem like symptoms of other, more serious digestive disorders. Your health care provider may want to perform tests to exclude these disorders.  TREATMENT Many medicines are available to help correct bowel function or relieve bowel spasms and abdominal pain. Among the medicines available are:  Laxatives for severe constipation and to help restore normal bowel habits.  Specific antidiarrheal medicines to treat severe or lasting diarrhea.  Antispasmodic agents to relieve intestinal cramps. Your health care provider may also decide to treat you with a mild tranquilizer or sedative during unusually stressful periods in your life. Your health care provider may also prescribe antidepressant medicine. The use of this medicine has been shown to reduce pain and other symptoms of IBS. Remember that if any medicine is prescribed for you, you should take it exactly as directed. Make sure your health care provider knows how well  it worked for you. HOME CARE INSTRUCTIONS   Take all medicines as directed by your health care provider.  Avoid foods that are high in fat or oils, such as heavy cream, butter, frankfurters, sausage, and other  fatty meats.  Avoid foods that make you go to the bathroom, such as fruit, fruit juice, and dairy products.  Cut out carbonated drinks, chewing gum, and "gassy" foods such as beans and cabbage. This may help relieve bloating and burping.  Eat foods with bran, and drink plenty of liquids with the bran foods. This helps relieve constipation.  Keep track of what foods seem to bring on your symptoms.  Avoid emotionally charged situations or circumstances that produce anxiety.  Start or continue exercising.  Get plenty of rest and sleep. Document Released: 01/12/2005 Document Revised: 01/17/2013 Document Reviewed: 09/02/2007 [facility] Patient Information 2015 [place], [place]. This information is not intended to replace advice given to you by your health care provider. Make sure you discuss any questions you have with your health care provider.  Crohn Disease Crohn disease is a long-term (chronic) soreness and redness (inflammation) of the intestines (bowel). It can affect any portion of the digestive tract, from the mouth to the anus. It can also cause problems outside the digestive tract. Crohn disease is closely related to a disease called ulcerative colitis (together, these two diseases are called inflammatory bowel disease).  CAUSES  The cause of Crohn disease is not known. One Nelva Bush is that, in an easily affected person, the immune system is triggered to attack the body's own digestive tissue. Crohn disease runs in families. It seems to be more common in certain geographic areas and amongst certain races. There are no clear-cut dietary causes.  SYMPTOMS  Crohn disease can cause many different symptoms since it can affect many different parts of the body. Symptoms include:  Fatigue.  Weight loss.  Chronic diarrhea, sometime bloody.  Abdominal pain and cramps.  Fever.  Ulcers or canker sores in the mouth or rectum.  Anemia (low red blood cells).  Arthritis, skin problems, and eye  problems may occur. Complications of Crohn disease can include:  Series of holes (perforation) of the bowel.  Portions of the intestines sticking to each other (adhesions).  Obstruction of the bowel.  Fistula formation, typically in the rectal area but also in other areas. A fistula is an opening between the bowels and the outside, or between the bowels and another organ.  A painful crack in the mucous membrane of the anus (rectal fissure). DIAGNOSIS  Your caregiver may suspect Crohn disease based on your symptoms and an exam. Blood tests may confirm that there is a problem. You may be asked to submit a stool specimen for examination. X-rays and CT scans may be necessary. Ultimately, the diagnosis is usually made after a procedure that uses a flexible tube that is inserted via your mouth or your anus. This is done under sedation and is called either an upper endoscopy or colonoscopy. With these tests, the specialist can take tiny tissue samples and remove them from the inside of the bowel (biopsy). Examination of this biopsy tissue under a microscope can reveal Crohn disease as the cause of your symptoms. Due to the many different forms that Crohn disease can take, symptoms may be present for several years before a diagnosis is made. TREATMENT  Medications are often used to decrease inflammation and control the immune system. These include medicines related to aspirin, steroid medications, and newer and stronger medications  to slow down the immune system. Some medications may be used as suppositories or enemas. A number of other medications are used or have been studied. Your caregiver will make specific recommendations. HOME CARE INSTRUCTIONS   Symptoms such as diarrhea can be controlled with medications. Avoid foods that have a laxative effect such as fresh fruit, vegetables, and dairy products. During flare-ups, you can rest your bowel by refraining from solid foods. Drink clear liquids  frequently during the day. (Electrolyte or rehydrating fluids are best. Your caregiver can help you with suggestions.) Drink often to prevent loss of body fluids (dehydration). When diarrhea has cleared, eat small meals and more frequently. Avoid food additives and stimulants such as caffeine (coffee, tea, or chocolate). Enzyme supplements may help if you develop intolerance to a sugar in dairy products (lactose). Ask your caregiver or dietitian about specific dietary instructions.  Try to maintain a positive attitude. Learn relaxation techniques such as self-hypnosis, mental imaging, and muscle relaxation.  If possible, avoid stresses which can aggravate your condition.  Exercise regularly.  Follow your diet.  Always get plenty of rest. SEEK MEDICAL CARE IF:   Your symptoms fail to improve after a week or two of new treatment.  You experience continued weight loss.  You have ongoing cramps or loose bowels.  You develop a new skin rash, skin sores, or eye problems. SEEK IMMEDIATE MEDICAL CARE IF:   You have worsening of your symptoms or develop new symptoms.  You have a fever.  You develop bloody diarrhea.  You develop severe abdominal pain. MAKE SURE YOU:   Understand these instructions.  Will watch your condition.  Will get help right away if you are not doing well or get worse. Document Released: 10/22/2004 Document Revised: 05/29/2013 Document Reviewed: 09/20/2006 [facility] Patient Information 2015 [place], [place]. This information is not intended to replace advice given to you by your health care provider. Make sure you discuss any questions you have with your health care provider.

## 2014-10-05 NOTE — Assessment & Plan Note (Addendum)
Long standing diarreha and abdominal pain consistent with possible  IBS-D or IBD. Ultrasound was negative for ovarian cyst. Start Viberzi in attempts to decrease diarreha. Recommend Activia or other probiotic daily. Unlikely diverticulitis based on length of symptoms.  Follow up with GI as scheduled for possible colonoscopy and additional assessment.

## 2014-10-05 NOTE — Progress Notes (Signed)
Pre visit review using our clinic review tool, if applicable. No additional management support is needed unless otherwise documented below in the visit note. 

## 2014-10-25 ENCOUNTER — Ambulatory Visit: Payer: Self-pay | Admitting: Internal Medicine

## 2015-05-10 ENCOUNTER — Ambulatory Visit (INDEPENDENT_AMBULATORY_CARE_PROVIDER_SITE_OTHER): Payer: Self-pay | Admitting: Family Medicine

## 2015-05-10 VITALS — BP 108/76 | HR 100 | Temp 98.4°F | Resp 16 | Ht 62.5 in | Wt 133.0 lb

## 2015-05-10 DIAGNOSIS — J0100 Acute maxillary sinusitis, unspecified: Secondary | ICD-10-CM

## 2015-05-10 MED ORDER — CEFDINIR 300 MG PO CAPS: 300.0000 mg | ORAL_CAPSULE | Freq: Two times a day (BID) | ORAL | Status: DC

## 2015-05-10 MED ORDER — PREDNISONE 10 MG PO TABS: 30.0000 mg | ORAL_TABLET | Freq: Every day | ORAL | Status: DC

## 2015-05-10 MED ORDER — IPRATROPIUM BROMIDE 0.06 % NA SOLN: 2.0000 | Freq: Four times a day (QID) | NASAL | Status: DC

## 2015-05-10 NOTE — Progress Notes (Signed)
Andrea HawthorneMarisa C Hansen is a 35 y.o. female who presents to Urgent Care today for Cough congestion and sinus pain and pressure. Symptoms present for one week worsening recently. Patient has right-sided sinus pain is worse than left. She notes clear mucus production. She denies any fevers chills but does note some posttussive vomiting and a mild amount of diarrhea.she has tried multiple over-the-counter medicines which have not been very effective. She notes drug allergies to clindamycin and doxycycline. Her last menstrual period was April 1 She is not currently pregnant nor she breast-feeding.   Past Medical History  Diagnosis Date  . SVT (supraventricular tachycardia) (HCC)   . Depression   . GERD (gastroesophageal reflux disease)   . Eating disorder   . Frequent headaches   . History of fainting spells of unknown cause   . Chicken pox   . UTI (lower urinary tract infection)   . IBS (irritable bowel syndrome)   . Ovarian cyst    No past surgical history on file. Social History  Substance Use Topics  . Smoking status: Current Every Day Smoker -- 1.00 packs/day for 21 years    Types: Cigarettes  . Smokeless tobacco: Never Used  . Alcohol Use: 3.6 oz/week    2 Glasses of wine, 2 Cans of beer, 2 Shots of liquor per week   ROS as above Medications: Current Outpatient Prescriptions  Medication Sig Dispense Refill  . cefdinir (OMNICEF) 300 MG capsule Take 1 capsule (300 mg total) by mouth 2 (two) times daily. 14 capsule 0  . Eluxadoline (VIBERZI) 100 MG TABS Take 100 mg by mouth 2 (two) times daily. (Patient not taking: Reported on 05/10/2015) 60 tablet 0  . ibuprofen (ADVIL,MOTRIN) 800 MG tablet Take 1 tablet (800 mg total) by mouth every 8 (eight) hours as needed. (Patient not taking: Reported on 05/10/2015) 21 tablet 0  . ipratropium (ATROVENT) 0.06 % nasal spray Place 2 sprays into both nostrils 4 (four) times daily. 15 mL 12  . oxyCODONE-acetaminophen (ROXICET) 5-325 MG per tablet Take 1  tablet by mouth every 8 (eight) hours as needed for severe pain. (Patient not taking: Reported on 05/10/2015) 20 tablet 0  . predniSONE (DELTASONE) 10 MG tablet Take 3 tablets (30 mg total) by mouth daily with breakfast. 15 tablet 0  . promethazine (PHENERGAN) 25 MG tablet Take 1 tablet (25 mg total) by mouth every 8 (eight) hours as needed for nausea or vomiting. (Patient not taking: Reported on 05/10/2015) 60 tablet 0   No current facility-administered medications for this visit.   Allergies  Allergen Reactions  . Clindamycin     Hives/ Swelling   . Doxycycline Itching    Nausea and vomitting     Exam:  BP 108/76 mmHg  Pulse 100  Temp(Src) 98.4 F (36.9 C) (Oral)  Resp 16  Ht 5' 2.5" (1.588 m)  Wt 133 lb (60.328 kg)  BMI 23.92 kg/m2  SpO2 97% Gen: Well NAD nontoxic appearing HEENT: EOMI,  MMM clear nasal discharge. Tender to palpation right maxillary sinus nontender left. Posterior pharynx with cobblestoning. Normal tympanic membranes bilaterally. Lungs: Normal work of breathing. CTABL Heart: RRR no MRG Abd: NABS, Soft. Nondistended, Nontender Exts: Brisk capillary refill, warm and well perfused.   No results found for this or any previous visit (from the past 24 hour(s)). No results found.  Assessment and Plan: 35 y.o. female with Sinusitis likely bacterial with second sickening. Treatment with Omnicef prednisone and Atrovent nasal spray.  Discussed warning signs or symptoms.  Please see discharge instructions. Patient expresses understanding.

## 2015-05-10 NOTE — Patient Instructions (Signed)
.Thank you for coming in today. Call or go to the emergency room if you get worse, have trouble breathing, have chest pains, or palpitations.  Take the prednisone and the antibiotic.  Use the nasal spray as needed.  Return if not better.   Sinusitis, Adult Sinusitis is redness, soreness, and inflammation of the paranasal sinuses. Paranasal sinuses are air pockets within the bones of your face. They are located beneath your eyes, in the middle of your forehead, and above your eyes. In healthy paranasal sinuses, mucus is able to drain out, and air is able to circulate through them by way of your nose. However, when your paranasal sinuses are inflamed, mucus and air can become trapped. This can allow bacteria and other germs to grow and cause infection. Sinusitis can develop quickly and last only a short time (acute) or continue over a long period (chronic). Sinusitis that lasts for more than 12 weeks is considered chronic. CAUSES Causes of sinusitis include:  Allergies.  Structural abnormalities, such as displacement of the cartilage that separates your nostrils (deviated septum), which can decrease the air flow through your nose and sinuses and affect sinus drainage.  Functional abnormalities, such as when the small hairs (cilia) that line your sinuses and help remove mucus do not work properly or are not present. SIGNS AND SYMPTOMS Symptoms of acute and chronic sinusitis are the same. The primary symptoms are pain and pressure around the affected sinuses. Other symptoms include:  Upper toothache.  Earache.  Headache.  Bad breath.  Decreased sense of smell and taste.  A cough, which worsens when you are lying flat.  Fatigue.  Fever.  Thick drainage from your nose, which often is green and may contain pus (purulent).  Swelling and warmth over the affected sinuses. DIAGNOSIS Your health care provider will perform a physical exam. During your exam, your health care provider may  perform any of the following to help determine if you have acute sinusitis or chronic sinusitis:  Look in your nose for signs of abnormal growths in your nostrils (nasal polyps).  Tap over the affected sinus to check for signs of infection.  View the inside of your sinuses using an imaging device that has a light attached (endoscope). If your health care provider suspects that you have chronic sinusitis, one or more of the following tests may be recommended:  Allergy tests.  Nasal culture. A sample of mucus is taken from your nose, sent to a lab, and screened for bacteria.  Nasal cytology. A sample of mucus is taken from your nose and examined by your health care provider to determine if your sinusitis is related to an allergy. TREATMENT Most cases of acute sinusitis are related to a viral infection and will resolve on their own within 10 days. Sometimes, medicines are prescribed to help relieve symptoms of both acute and chronic sinusitis. These may include pain medicines, decongestants, nasal steroid sprays, or saline sprays. However, for sinusitis related to a bacterial infection, your health care provider will prescribe antibiotic medicines. These are medicines that will help kill the bacteria causing the infection. Rarely, sinusitis is caused by a fungal infection. In these cases, your health care provider will prescribe antifungal medicine. For some cases of chronic sinusitis, surgery is needed. Generally, these are cases in which sinusitis recurs more than 3 times per year, despite other treatments. HOME CARE INSTRUCTIONS  Drink plenty of water. Water helps thin the mucus so your sinuses can drain more easily.  Use a  humidifier.  Inhale steam 3-4 times a day (for example, sit in the bathroom with the shower running).  Apply a warm, moist washcloth to your face 3-4 times a day, or as directed by your health care provider.  Use saline nasal sprays to help moisten and clean your  sinuses.  Take medicines only as directed by your health care provider.  If you were prescribed either an antibiotic or antifungal medicine, finish it all even if you start to feel better. SEEK IMMEDIATE MEDICAL CARE IF:  You have increasing pain or severe headaches.  You have nausea, vomiting, or drowsiness.  You have swelling around your face.  You have vision problems.  You have a stiff neck.  You have difficulty breathing.   This information is not intended to replace advice given to you by your health care provider. Make sure you discuss any questions you have with your health care provider.   Document Released: 01/12/2005 Document Revised: 02/02/2014 Document Reviewed: 01/27/2011 Elsevier Interactive Patient Education [employer]2016 Elsevier Inc.

## 2016-05-08 ENCOUNTER — Emergency Department (HOSPITAL_COMMUNITY)
Admission: EM | Admit: 2016-05-08 | Discharge: 2016-05-08 | Disposition: A | Discharge status: Home / Self Care | Payer: Self-pay | Attending: Physician Assistant | Admitting: Physician Assistant

## 2016-05-08 ENCOUNTER — Encounter (HOSPITAL_COMMUNITY): Payer: Self-pay | Admitting: Emergency Medicine

## 2016-05-08 DIAGNOSIS — M549 Dorsalgia, unspecified: Secondary | ICD-10-CM | POA: Insufficient documentation

## 2016-05-08 DIAGNOSIS — Z5321 Procedure and treatment not carried out due to patient leaving prior to being seen by health care provider: Secondary | ICD-10-CM | POA: Insufficient documentation

## 2016-05-08 LAB — COMPREHENSIVE METABOLIC PANEL
ALK PHOS: 41 U/L (ref 38–126)
ALT: 14 U/L (ref 14–54)
ANION GAP: 11 (ref 5–15)
AST: 18 U/L (ref 15–41)
Albumin: 3.7 g/dL (ref 3.5–5.0)
BILIRUBIN TOTAL: 0.5 mg/dL (ref 0.3–1.2)
BUN: <5 mg/dL — ABNORMAL LOW (ref 6–20)
CALCIUM: 9 mg/dL (ref 8.9–10.3)
CO2: 22 mmol/L (ref 22–32)
Chloride: 106 mmol/L (ref 101–111)
Creatinine, Ser: 0.68 mg/dL (ref 0.44–1.00)
GFR calc Af Amer: >60 mL/min (ref >60)
GFR calc non Af Amer: >60 mL/min (ref >60)
GLUCOSE: 103 mg/dL — AB (ref 65–99)
Potassium: 4 mmol/L (ref 3.5–5.1)
Sodium: 139 mmol/L (ref 135–145)
TOTAL PROTEIN: 6.3 g/dL — AB (ref 6.5–8.1)

## 2016-05-08 LAB — CBC
HEMATOCRIT: 41.7 % (ref 36.0–46.0)
Hemoglobin: 13.8 g/dL (ref 12.0–15.0)
MCH: 32.1 pg (ref 26.0–34.0)
MCHC: 33.1 g/dL (ref 30.0–36.0)
MCV: 97 fL (ref 78.0–100.0)
Platelets: 210 10*3/uL (ref 150–400)
RBC: 4.3 MIL/uL (ref 3.87–5.11)
RDW: 13.1 % (ref 11.5–15.5)
WBC: 10.9 10*3/uL — ABNORMAL HIGH (ref 4.0–10.5)

## 2016-05-08 LAB — URINALYSIS, ROUTINE W REFLEX MICROSCOPIC
BILIRUBIN URINE: NEGATIVE
GLUCOSE, UA: NEGATIVE mg/dL
HGB URINE DIPSTICK: NEGATIVE
KETONES UR: NEGATIVE mg/dL
Leukocytes, UA: NEGATIVE
Nitrite: NEGATIVE
PROTEIN: NEGATIVE mg/dL
Specific Gravity, Urine: 1.014 (ref 1.005–1.030)
pH: 5 (ref 5.0–8.0)

## 2016-05-08 MED ORDER — ONDANSETRON 4 MG PO TBDP
4.0000 mg | ORAL_TABLET | Freq: Once | ORAL | Status: AC
  Administered 2016-05-08: 4 mg via ORAL

## 2016-05-08 MED ORDER — ONDANSETRON 4 MG PO TBDP
ORAL_TABLET | ORAL | Status: DC
Start: 2016-05-08 — End: 2016-05-09
  Filled 2016-05-08: qty 1

## 2016-05-08 NOTE — ED Notes (Signed)
Pt c/o nausea with increased pain.  Given ODT Zofran.

## 2016-05-08 NOTE — ED Notes (Signed)
Pt provided stickers to EMT and stated she was leaving to go to [place].  Will d/c.

## 2016-05-08 NOTE — ED Triage Notes (Signed)
Patient with history of back pain for the last two weeks.  Patient states she got up in the middle of the night and her back went into spasms.  She states that she has been having increasing pain since it started.  She is now having some numbness in her feet and thighs, having some bladder leakage and pain with BMs.  Patient has been trying Tramadol with no relief.  Patient has been walking but it is painful.

## 2016-05-09 LAB — POC URINE PREG, ED: PREG TEST UR: NEGATIVE

## 2016-05-10 ENCOUNTER — Emergency Department (HOSPITAL_COMMUNITY): Payer: Self-pay

## 2016-05-10 ENCOUNTER — Encounter (HOSPITAL_COMMUNITY): Payer: Self-pay | Admitting: Emergency Medicine

## 2016-05-10 ENCOUNTER — Emergency Department (HOSPITAL_COMMUNITY)
Admission: EM | Admit: 2016-05-10 | Discharge: 2016-05-10 | Disposition: A | Discharge status: Home / Self Care | Payer: Self-pay | Attending: Emergency Medicine | Admitting: Emergency Medicine

## 2016-05-10 DIAGNOSIS — R102 Pelvic and perineal pain: Secondary | ICD-10-CM | POA: Insufficient documentation

## 2016-05-10 DIAGNOSIS — M545 Low back pain, unspecified: Secondary | ICD-10-CM

## 2016-05-10 DIAGNOSIS — F17210 Nicotine dependence, cigarettes, uncomplicated: Secondary | ICD-10-CM | POA: Insufficient documentation

## 2016-05-10 LAB — URINALYSIS, ROUTINE W REFLEX MICROSCOPIC
BILIRUBIN URINE: NEGATIVE
Glucose, UA: NEGATIVE mg/dL
HGB URINE DIPSTICK: NEGATIVE
Ketones, ur: NEGATIVE mg/dL
Leukocytes, UA: NEGATIVE
Nitrite: NEGATIVE
PH: 7 (ref 5.0–8.0)
Protein, ur: NEGATIVE mg/dL
SPECIFIC GRAVITY, URINE: 1.006 (ref 1.005–1.030)

## 2016-05-10 LAB — WET PREP, GENITAL
SPERM: NONE SEEN
TRICH WET PREP: NONE SEEN
Yeast Wet Prep HPF POC: NONE SEEN

## 2016-05-10 LAB — I-STAT BETA HCG BLOOD, ED (MC, WL, AP ONLY): I-stat hCG, quantitative: <5 m[IU]/mL

## 2016-05-10 LAB — COMPREHENSIVE METABOLIC PANEL WITH GFR
ALT: 15 U/L (ref 14–54)
AST: 21 U/L (ref 15–41)
Albumin: 3.7 g/dL (ref 3.5–5.0)
Alkaline Phosphatase: 40 U/L (ref 38–126)
Anion gap: 6 (ref 5–15)
BUN: 10 mg/dL (ref 6–20)
CO2: 26 mmol/L (ref 22–32)
Calcium: 8.7 mg/dL — ABNORMAL LOW (ref 8.9–10.3)
Chloride: 105 mmol/L (ref 101–111)
Creatinine, Ser: 0.76 mg/dL (ref 0.44–1.00)
GFR calc Af Amer: >60 mL/min
GFR calc non Af Amer: >60 mL/min
Glucose, Bld: 107 mg/dL — ABNORMAL HIGH (ref 65–99)
Potassium: 4.6 mmol/L (ref 3.5–5.1)
Sodium: 137 mmol/L (ref 135–145)
Total Bilirubin: 0.4 mg/dL (ref 0.3–1.2)
Total Protein: 6.6 g/dL (ref 6.5–8.1)

## 2016-05-10 LAB — CBC
HCT: 41 % (ref 36.0–46.0)
HEMOGLOBIN: 14 g/dL (ref 12.0–15.0)
MCH: 33 pg (ref 26.0–34.0)
MCHC: 34.1 g/dL (ref 30.0–36.0)
MCV: 96.7 fL (ref 78.0–100.0)
Platelets: 212 10*3/uL (ref 150–400)
RBC: 4.24 MIL/uL (ref 3.87–5.11)
RDW: 12.8 % (ref 11.5–15.5)
WBC: 11 10*3/uL — ABNORMAL HIGH (ref 4.0–10.5)

## 2016-05-10 LAB — LIPASE, BLOOD: Lipase: 22 U/L (ref 11–51)

## 2016-05-10 MED ORDER — OXYCODONE-ACETAMINOPHEN 5-325 MG PO TABS
1.0000 | ORAL_TABLET | Freq: Once | ORAL | Status: AC
  Administered 2016-05-10: 1 via ORAL
  Filled 2016-05-10: qty 1

## 2016-05-10 MED ORDER — KETOROLAC TROMETHAMINE 15 MG/ML IJ SOLN
15.0000 mg | Freq: Once | INTRAMUSCULAR | Status: AC
  Administered 2016-05-10: 15 mg via INTRAVENOUS
  Filled 2016-05-10: qty 1

## 2016-05-10 MED ORDER — IOPAMIDOL (ISOVUE-300) INJECTION 61%
100.0000 mL | Freq: Once | INTRAVENOUS | Status: AC | PRN
  Administered 2016-05-10: 100 mL via INTRAVENOUS

## 2016-05-10 MED ORDER — AZITHROMYCIN 1 G PO PACK
1.0000 g | PACK | Freq: Once | ORAL | Status: AC
  Administered 2016-05-10: 1 g via ORAL
  Filled 2016-05-10: qty 1

## 2016-05-10 MED ORDER — NAPROXEN 500 MG PO TABS: 500.0000 mg | ORAL_TABLET | Freq: Two times a day (BID) | ORAL | 0 refills | Status: DC

## 2016-05-10 MED ORDER — SODIUM CHLORIDE 0.9 % IV BOLUS (SEPSIS)
1000.0000 mL | Freq: Once | INTRAVENOUS | Status: AC
  Administered 2016-05-10: 1000 mL via INTRAVENOUS

## 2016-05-10 MED ORDER — METHYLPREDNISOLONE 4 MG PO TBPK: ORAL_TABLET | ORAL | 0 refills | Status: DC

## 2016-05-10 MED ORDER — DEXTROSE 5 % IV SOLN
250.0000 mg | Freq: Once | INTRAVENOUS | Status: AC
  Administered 2016-05-10: 250 mg via INTRAVENOUS
  Filled 2016-05-10: qty 250

## 2016-05-10 MED ORDER — OXYCODONE-ACETAMINOPHEN 5-325 MG PO TABS: 1.0000 | ORAL_TABLET | ORAL | 0 refills | Status: DC | PRN

## 2016-05-10 NOTE — Care Management Note (Signed)
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 She has been experiencing back pain. Reports she has an appt with [facility] on [place], May 14, 2016 at 08:45 am. Provided pt with information on [facility] clinics to follow up if appt needed with PCP.    Expected Discharge Date:  05/10/2016               Expected Discharge Plan:  Home/Self Care  In-House Referral:  NA  Discharge planning Services  CM Consult, [facility]  Post Acute Care Choice:    Choice offered to:  NA  DME Arranged:  N/A DME Agency:  NA  HH Arranged:  NA HH Agency:  NA  Status of Service:  Completed, signed off  If discussed at Long Length of Stay Meetings, dates discussed:    Additional Comments:  Elliot Cousin, RN 05/10/2016, 6:01 PM

## 2016-05-10 NOTE — ED Triage Notes (Signed)
Patient states that over the past 2 weeks having mid to lower back. 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 Patient states tingling sensations in legs and feet after standing long hours.  Patient states that she has little urinary dribble.  Pt states that she las LLQ pain and has PMH ovarian cyst.  Patient getting nauseated from pain.  Patient saw Urgent Care on Friday and were concerned with herniated disc and to follow up with ED.

## 2016-05-10 NOTE — ED Provider Notes (Signed)
[facility]   CSN: [id number] Arrival date & time: 05/10/16  1159     History   Chief Complaint Chief Complaint  Patient presents with  . Back Pain  . LLQ Pain    HPI [name] is a 36 y.o. female.  The history is provided by the patient. No language interpreter was used.  Back Pain     [name] is a 36 y.o. female who presents to the Emergency Department complaining of back pain/abdominal pain.  She reports 2 weeks of back pain followed by a week and a half of abdominal pain. Her back pain is in her lower thoracic and mid lumbar region. Pain is aching and constant in nature with sharp and burning episodes. The pain radiates to her left abdomen and thigh. Over the last week she's had intermittent tingling sensation in bilateral thighs with occasional dribbling urination.No urinary retention or incontinence. She has chronic vaginal discharge which is unchanged from baseline. No reports of injuries, fevers, vomiting, nausea. She does have severe pain on defecation.  Past Medical History:  Diagnosis Date  . Chicken pox   . Depression   . Eating disorder   . Frequent headaches   . GERD (gastroesophageal reflux disease)   . History of fainting spells of unknown cause   . IBS (irritable bowel syndrome)   . Ovarian cyst   . SVT (supraventricular tachycardia) (HCC)   . UTI (lower urinary tract infection)     There are no active problems to display for this patient.   History reviewed. No pertinent surgical history.  OB History    No data available       Home Medications    Prior to Admission medications   Medication Sig Start Date End Date Taking? Authorizing Provider  fluticasone [facility] ALLERGY RELIEF) 50 MCG/ACT nasal spray Place 1 spray into both nostrils daily.   Yes Historical Provider, MD  omeprazole (PRILOSEC OTC) 20 MG tablet Take 20 mg by mouth daily.   Yes Historical Provider, MD  Eluxadoline (VIBERZI) 100 MG TABS  Take 100 mg by mouth 2 (two) times daily. Patient not taking: Reported on 05/10/2015 10/05/14   Veryl Speak, FNP  ipratropium (ATROVENT) 0.06 % nasal spray Place 2 sprays into both nostrils 4 (four) times daily. Patient not taking: Reported on 05/10/2016 05/10/15   Rodolph Bong, MD  methylPREDNISolone (MEDROL DOSEPAK) 4 MG TBPK tablet Take according to label instructions 05/10/16   Tilden Fossa, MD  naproxen (NAPROSYN) 500 MG tablet Take 1 tablet (500 mg total) by mouth 2 (two) times daily. 05/10/16   Tilden Fossa, MD  oxyCODONE-acetaminophen (PERCOCET/ROXICET) 5-325 MG tablet Take 1 tablet by mouth every 4 (four) hours as needed for severe pain. 05/10/16   Tilden Fossa, MD    Family History Family History  Problem Relation Age of Onset  . Hyperlipidemia Mother   . Hypertension Mother   . Bipolar disorder Mother   . Hyperlipidemia Father   . Hypertension Father   . Arthritis Maternal Grandmother   . Arthritis Maternal Grandfather   . Arthritis Paternal Grandmother   . Arthritis Paternal Grandfather     Social History Social History  Substance Use Topics  . Smoking status: Current Every Day Smoker    Packs/day: 1.00    Years: 21.00    Types: Cigarettes  . Smokeless tobacco: Never Used  . Alcohol use 3.6 oz/week    2 Glasses of wine, 2 Cans of beer,  2 Shots of liquor per week     Allergies   Clindamycin and Doxycycline   Review of Systems Review of Systems  Musculoskeletal: Positive for back pain.  All other systems reviewed and are negative.    Physical Exam Updated Vital Signs BP 115/88 (BP Location: Left Arm)   Pulse 80   Temp 98 F (36.7 C) (Oral)   Resp 17   LMP 04/22/2016 (Exact Date)   SpO2 97%   Physical Exam  Constitutional: She is oriented to person, place, and time. She appears well-developed and well-nourished.  HENT:  Head: Normocephalic and atraumatic.  Cardiovascular: Normal rate and regular rhythm.   No murmur heard. Pulmonary/Chest:  Effort normal and breath sounds normal. No respiratory distress.  Abdominal: Soft. There is no rebound and no guarding.  Moderate diffuse abdominal tenderness, greatest over the left lower quadrant.  Genitourinary:  Genitourinary Comments: Moderate yellowish vaginal discharge. Mild CMT.  Musculoskeletal: She exhibits no edema.  Tenderness to palpation over lower thoracic and lower lumbar spine.  Neurological: She is alert and oriented to person, place, and time.  5/5 strength in all 4 extremities with sensation to light touch intact in all 4 extremities. Downgoing toes bilaterally. No ankle clonus. 2+ patellar reflexes bilaterally.  Skin: Skin is warm and dry.  Psychiatric: She has a normal mood and affect. Her behavior is normal.  Nursing note and vitals reviewed.    ED Treatments / Results  Labs (all labs ordered are listed, but only abnormal results are displayed) Labs Reviewed  WET PREP, GENITAL - Abnormal; Notable for the following:       Result Value   Clue Cells Wet Prep HPF POC PRESENT (*)    WBC, Wet Prep HPF POC MANY (*)    All other components within normal limits  COMPREHENSIVE METABOLIC PANEL - Abnormal; Notable for the following:    Glucose, Bld 107 (*)    Calcium 8.7 (*)    All other components within normal limits  CBC - Abnormal; Notable for the following:    WBC 11.0 (*)    All other components within normal limits  URINALYSIS, ROUTINE W REFLEX MICROSCOPIC - Abnormal; Notable for the following:    Color, Urine STRAW (*)    All other components within normal limits  LIPASE, BLOOD  I-STAT BETA HCG BLOOD, ED ([facility], [facility], [facility] ONLY)  GC/CHLAMYDIA PROBE AMP ([place]) NOT AT [facility]    EKG  EKG Interpretation None       Radiology Ct Abdomen Pelvis W Contrast  Result Date: 05/10/2016 CLINICAL DATA:  36 year old female with left abdominal pain for 1.5 weeks. Left lower quadrant and back pain. EXAM: CT ABDOMEN AND PELVIS WITH CONTRAST TECHNIQUE: Multidetector CT  imaging of the abdomen and pelvis was performed using the standard protocol following bolus administration of intravenous contrast. CONTRAST:  ISOVUE-300 IOPAMIDOL (ISOVUE-300) INJECTION 61% COMPARISON:  CT Abdomen and Pelvis 07/18/2014 FINDINGS: Lower chest: Pectus excavatum re- demonstrated. Mild lung base atelectasis. No pericardial or pleural effusion. Hepatobiliary: Negative liver and gallbladder. Pancreas: Negative. Spleen: Negative. Adrenals/Urinary Tract: Normal adrenal glands. The kidneys appear stable and normal. Normal bilateral renal enhancement. No perinephric stranding. No hydronephrosis or hydroureter. Unremarkable urinary bladder. No abdominal free fluid. Stomach/Bowel: Retained stool in the rectum. Negative sigmoid colon. Negative left colon. Retained stool in the transverse colon and at both flexures. Mild retained stool in the right colon. Negative appendix. Negative terminal ileum. No dilated small bowel. Negative stomach and duodenum. Vascular/Lymphatic: Major arterial  structures in the abdomen and pelvis are normal. Portal venous system appears patent. No lymphadenopathy.  Chronic pelvic phleboliths are stable. Reproductive: IUD no longer in place. Uterus and adnexa within normal limits; left ovary appears normal and there is an enhancing probable physiologic right ovarian cyst (series 2, image 57). Other: No definite pelvic free fluid. Musculoskeletal: Slight levoconvex spinal curvature. Stable visualized osseous structures. No acute osseous abnormality identified. IMPRESSION: 1. Negative CT Abdomen and Pelvis. 2. Pectus excavatum. Electronically Signed   By: Odessa Fleming M.D.   On: 05/10/2016 17:27    Procedures Procedures (including critical care time)  Medications Ordered in ED Medications  sodium chloride 0.9 % bolus 1,000 mL (0 mLs Intravenous Stopped 05/10/16 1927)  oxyCODONE-acetaminophen (PERCOCET/ROXICET) 5-325 MG per tablet 1 tablet (1 tablet Oral Given 05/10/16 1552)    iopamidol (ISOVUE-300) 61 % injection 100 mL (100 mLs Intravenous Contrast Given 05/10/16 1636)  ketorolac (TORADOL) 15 MG/ML injection 15 mg (15 mg Intravenous Given 05/10/16 1749)  cefTRIAXone (ROCEPHIN) 250 mg in dextrose 5 % 50 mL IVPB (0 mg Intravenous Stopped 05/10/16 1927)  azithromycin (ZITHROMAX) powder 1 g (1 g Oral Given 05/10/16 1825)     Initial Impression / Assessment and Plan / ED Course  I have reviewed the triage vital signs and the nursing notes.  Pertinent labs & imaging results that were available during my care of the patient were reviewed by me and considered in my medical decision making (see chart for details).     Patient here for evaluation of bowel pain and back pain for the last few weeks. She is neurologically intact on examination with no red flags for epidural abscess. Pelvic examination with discharge and mild CMT. Presentation is not consistent with PID, patient denies any new sexual partners. Will treat for possible cervicitis. Patient states her vaginal discharge is chronic and not symptomatic, we will not treat for BV at this time. Discussed outpatient importance of follow-up for her back pain as this warrants further evaluation given her symptoms. Unable to obtain MRI at this time as patient has no neurologic deficits on examination. Counseled patient on home care for back pain as well as abdominal pain with importance of return precautions if she has any worsening or new concerning symptoms.  Final Clinical Impressions(s) / ED Diagnoses   Final diagnoses:  Acute midline low back pain without sciatica  Pelvic pain    New Prescriptions Discharge Medication List as of 05/10/2016  6:18 PM    START taking these medications   Details  methylPREDNISolone (MEDROL DOSEPAK) 4 MG TBPK tablet Take according to label instructions, Print    naproxen (NAPROSYN) 500 MG tablet Take 1 tablet (500 mg total) by mouth 2 (two) times daily., Starting Sun 05/10/2016, Print          Tilden Fossa, MD 05/11/16 [phone]

## 2016-05-11 LAB — GC/CHLAMYDIA PROBE AMP (~~LOC~~) NOT AT ARMC
Chlamydia: NEGATIVE
Neisseria Gonorrhea: NEGATIVE

## 2016-05-12 ENCOUNTER — Ambulatory Visit (INDEPENDENT_AMBULATORY_CARE_PROVIDER_SITE_OTHER): Payer: Self-pay

## 2016-05-12 ENCOUNTER — Ambulatory Visit (INDEPENDENT_AMBULATORY_CARE_PROVIDER_SITE_OTHER): Payer: Self-pay | Admitting: Orthopaedic Surgery

## 2016-05-12 ENCOUNTER — Encounter (INDEPENDENT_AMBULATORY_CARE_PROVIDER_SITE_OTHER): Payer: Self-pay | Admitting: Orthopaedic Surgery

## 2016-05-12 VITALS — BP 115/69 | HR 101 | Ht 61.0 in | Wt 126.0 lb

## 2016-05-12 DIAGNOSIS — M5441 Lumbago with sciatica, right side: Secondary | ICD-10-CM

## 2016-05-12 DIAGNOSIS — M5442 Lumbago with sciatica, left side: Secondary | ICD-10-CM

## 2016-05-12 NOTE — Progress Notes (Signed)
Office Visit Note   Patient: [name]           Date of Birth: 10/28/1980           MRN: [id number] Visit Date: 05/12/2016              Requested by: No referring provider defined for this encounter. PCP: No PCP Per Patient   Assessment & Plan: Visit Diagnoses:  1. Acute bilateral low back pain with bilateral sciatica     Plan: Patient is neurologically intact. Previous CT scans of the abdomen but this year and 2 years ago shows some disc space narrowing at L1-2 and ossified disc protrusion. There is no change in the last 2 years but likely she has had some protrusion consistent with her symptoms. On the abdominal CT scan I did not see any other levels with disc protrusion centrally. Work slip given for no work and from 05/08/2016 until 05/17/2016. She can start some physical therapy and I will recheck her again in 4 weeks. Will apply a lumbosacral corset see if this gives her some improvement.  Follow-Up Instructions: No Follow-up on file.   Orders:  Orders Placed This Encounter  Procedures  . XR Lumbar Spine 2-3 Views   No orders of the defined types were placed in this encounter.     Procedures: No procedures performed   Clinical Data: No additional findings.   Subjective: Chief Complaint  Patient presents with  . Lower Back - Pain    HPI patient states she started having significant pain about 3 days before Easter proximally 04/23/2016. States she's not been able to get comfortable she works in [employer] normally works pretty much constantly. She's been out of work since 05/08/2016. States pain is been severe she's used X oxycodone with slight relief also Naprosyn also prednisone pack. After 15 minutes started having some spasms in her legs the pain is at the lumbosacral junction radiates down to L5 in the midline. No fever or chills no bowel or bladder symptoms associated with her pain.  Review of Systems postarrest her sinusitis, SVT, depression, GERD, he  disorder, history of UTIs, IBS, , ovarian . Negative for heart disease.   Objective: Vital Signs: BP 115/69   Pulse (!) 101   Ht  (1.549 m)   Wt 126 lb (57.2 kg)   LMP 04/22/2016 (Exact Date)   BMI 23.81 kg/m   Physical Exam  Constitutional: She is oriented to person, place, and time. She appears well-developed.  HENT:  Head: Normocephalic.  Right Ear: External ear normal.  Left Ear: External ear normal.  Eyes: Pupils are equal, round, and reactive to light.  Neck: No tracheal deviation present. No thyromegaly present.  Cardiovascular: Normal rate.   Pulmonary/Chest: Effort normal.  Abdominal: Soft.  Musculoskeletal:  Patient's family get from sitting to standing slow. She is tenderness with paralumbar palpation. Tenderness extends from T10 down to L5. She has some mild sciatic tenderness negative straight leg raising 90 no pain with hip internal and external rotation. 1+ knee and ankle jerk which is symmetrical. Anterior tib EHL is intact. No hip flexion weakness.  Neurological: She is alert and oriented to person, place, and time.  Skin: Skin is warm and dry.  Psychiatric: She has a normal mood and affect. Her behavior is normal.    Ortho Exam  Specialty Comments:  No specialty comments available.  Imaging: No results found.   PMFS History: There are no active problems to display  for this patient.  Past Medical History:  Diagnosis Date  . Chicken pox   . Depression   . Eating disorder   . Frequent headaches   . GERD (gastroesophageal reflux disease)   . History of fainting spells of unknown cause   . IBS (irritable bowel syndrome)   . Ovarian cyst   . SVT (supraventricular tachycardia) (HCC)   . UTI (lower urinary tract infection)     Family History  Problem Relation Age of Onset  . Hyperlipidemia Mother   . Hypertension Mother   . Bipolar disorder Mother   . Hyperlipidemia Father   . Hypertension Father   . Arthritis Maternal Grandmother   .  Arthritis Maternal Grandfather   . Arthritis Paternal Grandmother   . Arthritis Paternal Grandfather     No past surgical history on file. Social History   Occupational History  . Marketing    Social History Main Topics  . Smoking status: Current Every Day Smoker    Packs/day: 1.00    Years: 21.00    Types: Cigarettes  . Smokeless tobacco: Never Used  . Alcohol use 3.6 oz/week    2 Glasses of wine, 2 Cans of beer, 2 Shots of liquor per week  . Drug use: Yes    Types: Marijuana  . Sexual activity: Yes    Birth control/ protection: None, Condom

## 2016-05-12 NOTE — Addendum Note (Signed)
Addended by: Rogers Seeds on: 05/12/2016 04:24 PM   Modules accepted: Orders

## 2016-05-14 ENCOUNTER — Ambulatory Visit (INDEPENDENT_AMBULATORY_CARE_PROVIDER_SITE_OTHER): Payer: Self-pay | Admitting: Orthopaedic Surgery

## 2016-05-26 ENCOUNTER — Ambulatory Visit: Payer: Self-pay | Admitting: Physical Therapy

## 2016-06-10 ENCOUNTER — Ambulatory Visit (INDEPENDENT_AMBULATORY_CARE_PROVIDER_SITE_OTHER): Payer: Self-pay | Admitting: Orthopaedic Surgery

## 2016-06-10 ENCOUNTER — Ambulatory Visit: Payer: Self-pay | Attending: Orthopaedic Surgery | Admitting: Physical Therapy

## 2016-06-16 ENCOUNTER — Telehealth: Payer: Self-pay | Admitting: Physical Therapy

## 2016-06-16 NOTE — Telephone Encounter (Signed)
06/10/16 no show for PT eval, unable to leave message on 06/16/16, mail box not set up

## 2017-04-06 ENCOUNTER — Telehealth: Payer: Self-pay | Admitting: Emergency Medicine

## 2017-04-06 NOTE — Telephone Encounter (Signed)
Called pt to remind them of their appt tomorrow. Advised them of the time, building number, early arrival and late policy. °

## 2017-04-07 ENCOUNTER — Other Ambulatory Visit: Payer: Self-pay

## 2017-04-07 ENCOUNTER — Ambulatory Visit (INDEPENDENT_AMBULATORY_CARE_PROVIDER_SITE_OTHER): Payer: Self-pay | Admitting: Emergency Medicine

## 2017-04-07 ENCOUNTER — Encounter: Payer: Self-pay | Admitting: Emergency Medicine

## 2017-04-07 ENCOUNTER — Telehealth: Payer: Self-pay | Admitting: Emergency Medicine

## 2017-04-07 VITALS — BP 105/68 | HR 87 | Temp 98.6°F | Resp 16 | Ht 62.5 in | Wt 132.4 lb

## 2017-04-07 DIAGNOSIS — N926 Irregular menstruation, unspecified: Secondary | ICD-10-CM | POA: Insufficient documentation

## 2017-04-07 DIAGNOSIS — N939 Abnormal uterine and vaginal bleeding, unspecified: Secondary | ICD-10-CM | POA: Insufficient documentation

## 2017-04-07 LAB — POCT CBC
GRANULOCYTE PERCENT: 52.2 % (ref 37–80)
HCT, POC: 38.4 % (ref 37.7–47.9)
Hemoglobin: 13.2 g/dL (ref 12.2–16.2)
Lymph, poc: 3.2 (ref 0.6–3.4)
MCH, POC: 33 pg — AB (ref 27–31.2)
MCHC: 34.3 g/dL (ref 31.8–35.4)
MCV: 96.2 fL (ref 80–97)
MID (CBC): 0.6 (ref 0–0.9)
MPV: 5.3 fL (ref 0–99.8)
PLATELET COUNT, POC: 278 10*3/uL (ref 142–424)
POC Granulocyte: 4.2 (ref 2–6.9)
POC LYMPH PERCENT: 40.4 %L (ref 10–50)
POC MID %: 7.4 %M (ref 0–12)
RBC: 3.99 M/uL — AB (ref 4.04–5.48)
RDW, POC: 13.4 %
WBC: 8 10*3/uL (ref 4.6–10.2)

## 2017-04-07 LAB — POCT URINE PREGNANCY: Preg Test, Ur: NEGATIVE

## 2017-04-07 NOTE — Telephone Encounter (Signed)
Call[facility] and spoke with [facility]. They stated we will need to send referral first, but should be able to get pt in by next week. I spoke with pt and she said this was fine to send there and [facility]. Referral sent 3/13 and appt canceled with [facility]

## 2017-04-07 NOTE — Telephone Encounter (Signed)
Good job, thanks.

## 2017-04-07 NOTE — Telephone Encounter (Signed)
If you're able to get her sooner somewhere else, please do. Thanks.

## 2017-04-07 NOTE — Patient Instructions (Addendum)
     IF you received an x-ray today, you will receive an invoice from [facility]. Please contact [facility] at [phone] with questions or concerns regarding your invoice.   IF you received labwork today, you will receive an invoice from [facility]. Please contact [facility] at [phone] with questions or concerns regarding your invoice.   Our billing staff will not be able to assist you with questions regarding bills from these companies.  You will be contacted with the lab results as soon as they are available. The fastest way to get your results is to activate your My Chart account. Instructions are located on the last page of this paperwork. If you have not heard from us regarding the results in 2 weeks, please contact this office.     Abnormal Uterine Bleeding Abnormal uterine bleeding means bleeding more than usual from your uterus. It can include:  Bleeding between periods.  Bleeding after sex.  Bleeding that is heavier than normal.  Periods that last longer than usual.  Bleeding after you have stopped having your period (menopause).  There are many problems that may cause this. You should see a doctor for any kind of bleeding that is not normal. Treatment depends on the cause of the bleeding. Follow these instructions at home:  Watch your condition for any changes.  Do not use tampons, douche, or have sex, if your doctor tells you not to.  Change your pads often.  Get regular well-woman exams. Make sure they include a pelvic exam and cervical cancer screening.  Keep all follow-up visits as told by your doctor. This is important. Contact a doctor if:  The bleeding lasts more than one week.  You feel dizzy at times.  You feel like you are going to throw up (nauseous).  You throw up. Get help right away if:  You pass out.  You have to change pads every hour.  You have belly (abdominal) pain.  You have a fever.  You get sweaty.  You  get weak.  You passing large blood clots from your vagina. Summary  Abnormal uterine bleeding means bleeding more than usual from your uterus.  There are many problems that may cause this. You should see a doctor for any kind of bleeding that is not normal.  Treatment depends on the cause of the bleeding. This information is not intended to replace advice given to you by your health care provider. Make sure you discuss any questions you have with your health care provider. Document Released: 11/09/2008 Document Revised: 01/07/2016 Document Reviewed: 01/07/2016 Elsevier Interactive Patient Education  2017 Elsevier Inc.  

## 2017-04-07 NOTE — Assessment & Plan Note (Signed)
Pregnancy test negative.  Vital signs normal.  Benign exam.  Normal CBC.  DUB most likely.  Needs GYN evaluation.

## 2017-04-07 NOTE — Telephone Encounter (Signed)
[facility] urgent [facility]. They have scheduled pt for 04/20/17. Should we try to get a sooner appt somewhere else?

## 2017-04-07 NOTE — Progress Notes (Signed)
[name] 36 y.o.   Chief Complaint  Patient presents with  . Vaginal Bleeding    per patient since Friday with cramping    HISTORY OF PRESENT ILLNESS: This is a 37 y.o. female complaining of abnormal menstrual cycle that started last Friday, 5-6 days ago.  She states  it is not the typical cramping, no the same consistency, and it came on early.  Was concerned about a miscarriage initially but had to home pregnancy tests negative.  Sexually active.  Has not seen a GYN doctor in a while.  Bleeding is nonhemorrhagic, 1 panty liner per day.  Denies nausea or vomiting.  Denies syncope.  No other significant symptoms.  HPI   Prior to Admission medications   Medication Sig Start Date End Date Taking? Authorizing Provider  fluticasone [facility] ALLERGY RELIEF) 50 MCG/ACT nasal spray Place 1 spray into both nostrils daily.   Yes [provider]  omeprazole (PRILOSEC OTC) 20 MG tablet Take 20 mg by mouth daily.   Yes [provider]  Eluxadoline (VIBERZI) 100 MG TABS Take 100 mg by mouth 2 (two) times daily. Patient not taking: Reported on 05/10/2015 10/05/14   Veryl Speak, FNP  ipratropium (ATROVENT) 0.06 % nasal spray Place 2 sprays into both nostrils 4 (four) times daily. Patient not taking: Reported on 05/10/2016 05/10/15   Rodolph Bong, MD  methylPREDNISolone (MEDROL DOSEPAK) 4 MG TBPK tablet Take according to label instructions Patient not taking: Reported on 04/07/2017 05/10/16   Tilden Fossa, MD  naproxen (NAPROSYN) 500 MG tablet Take 1 tablet (500 mg total) by mouth 2 (two) times daily. Patient not taking: Reported on 04/07/2017 05/10/16   Tilden Fossa, MD  oxyCODONE-acetaminophen (PERCOCET/ROXICET) 5-325 MG tablet Take 1 tablet by mouth every 4 (four) hours as needed for severe pain. Patient not taking: Reported on 04/07/2017 05/10/16   Tilden Fossa, MD    Allergies  Allergen Reactions  . Clindamycin     Hives/ Swelling   . Doxycycline Itching   Nausea and vomitting    There are no active problems to display for this patient.   Past Medical History:  Diagnosis Date  . Chicken pox   . Depression   . Eating disorder   . Frequent headaches   . GERD (gastroesophageal reflux disease)   . History of fainting spells of unknown cause   . IBS (irritable bowel syndrome)   . Ovarian cyst   . SVT (supraventricular tachycardia) (HCC)   . UTI (lower urinary tract infection)     No past surgical history on file.  Social History   Socioeconomic History  . Marital status: Divorced    Spouse name: Not on file  . Number of children: 3  . Years of education: 36  . Highest education level: Not on file  Social Needs  . Financial resource strain: Not on file  . Food insecurity - worry: Not on file  . Food insecurity - inability: Not on file  . Transportation needs - medical: Not on file  . Transportation needs - non-medical: Not on file  Occupational History  . Occupation: Chief Financial Officer  Tobacco Use  . Smoking status: Current Every Day Smoker    Packs/day: 1.00    Years: 21.00    Pack years: 21.00    Types: Cigarettes  . Smokeless tobacco: Never Used  Substance and Sexual Activity  . Alcohol use: Yes    Alcohol/week: 3.6 oz    Types: 2 Glasses of wine, 2  Cans of beer, 2 Shots of liquor per week  . Drug use: Yes    Types: Marijuana  . Sexual activity: Yes    Birth control/protection: None, Condom  Other Topics Concern  . Not on file  Social History Narrative   Fun: Clinical cytogeneticist, work, Sports administrator   Denies religious beliefs effecting health care   Denies abuse and feels safe at home.     Family History  Problem Relation Age of Onset  . Hyperlipidemia Mother   . Hypertension Mother   . Bipolar disorder Mother   . Hyperlipidemia Father   . Hypertension Father   . Arthritis Maternal Grandmother   . Arthritis Maternal Grandfather   . Arthritis Paternal Grandmother   . Arthritis Paternal Grandfather      Review of Systems    Constitutional: Negative.  Negative for chills and fever.  HENT: Negative.   Eyes: Negative.   Respiratory: Negative.  Negative for cough and shortness of breath.   Cardiovascular: Negative.  Negative for chest pain and palpitations.  Gastrointestinal: Negative.  Negative for abdominal pain, blood in stool, diarrhea, melena, nausea and vomiting.  Genitourinary: Negative.   Musculoskeletal: Negative.  Negative for back pain, myalgias and neck pain.  Skin: Negative.  Negative for rash.  Neurological: Negative.  Negative for dizziness, focal weakness, loss of consciousness and headaches.  Endo/Heme/Allergies: Negative.   All other systems reviewed and are negative.   Vitals:   04/07/17 0820  BP: 105/68  Pulse: 87  Resp: 16  Temp: 98.6 F (37 C)  SpO2: 99%    Physical Exam  Constitutional: She is oriented to person, place, and time. She appears well-developed and well-nourished.  HENT:  Head: Normocephalic and atraumatic.  Right Ear: External ear normal.  Left Ear: External ear normal.  Mouth/Throat: Oropharynx is clear and moist.  Eyes: Conjunctivae and EOM are normal. Pupils are equal, round, and reactive to light.  Neck: Normal range of motion. Neck supple.  Cardiovascular: Normal rate, regular rhythm and normal heart sounds.  Pulmonary/Chest: Effort normal and breath sounds normal. No respiratory distress.  Abdominal: Soft. Bowel sounds are normal. She exhibits no distension. There is no tenderness.  Musculoskeletal: Normal range of motion.  Neurological: She is alert and oriented to person, place, and time. No sensory deficit. She exhibits normal muscle tone.  Skin: Skin is warm and dry. Capillary refill takes less than 2 seconds.  Psychiatric: She has a normal mood and affect. Her behavior is normal.  Vitals reviewed.   Results for orders placed or performed in visit on 04/07/17 (from the past 24 hour(s))  POCT urine pregnancy     Status: None   Collection Time:  04/07/17  8:38 AM  Result Value Ref Range   Preg Test, Ur Negative Negative  POCT CBC     Status: Abnormal   Collection Time: 04/07/17  8:47 AM  Result Value Ref Range   WBC 8.0 4.6 - 10.2 K/uL   Lymph, poc 3.2 0.6 - 3.4   POC LYMPH PERCENT 40.4 10 - 50 %L   MID (cbc) 0.6 0 - 0.9   POC MID % 7.4 0 - 12 %M   POC Granulocyte 4.2 2 - 6.9   Granulocyte percent 52.2 37 - 80 %G   RBC 3.99 (A) 4.04 - 5.48 M/uL   Hemoglobin 13.2 12.2 - 16.2 g/dL   HCT, POC 16.1 09.6 - 47.9 %   MCV 96.2 80 - 97 fL   MCH, POC 33.0 (A)  27 - 31.2 pg   MCHC 34.3 31.8 - 35.4 g/dL   RDW, POC 16.113.4 %   Platelet Count, POC 278 142 - 424 K/uL   MPV 5.3 0 - 99.8 fL   Abnormal menses Pregnancy test negative.  Vital signs normal.  Benign exam.  Normal CBC.  DUB most likely.  Needs GYN evaluation.   ASSESSMENT & PLAN: [name] was seen today for vaginal bleeding.  Diagnoses and all orders for this visit:  Abnormal menses -     POCT CBC -     POCT urine pregnancy -     Comprehensive metabolic panel -     Ambulatory referral to Gynecology  Abnormal uterine bleeding    Patient Instructions       IF you received an x-ray today, you will receive an invoice from [facility]. Please contact [facility] at [phone] with questions or concerns regarding your invoice.   IF you received labwork today, you will receive an invoice from [facility]. Please contact [facility] at [phone] with questions or concerns regarding your invoice.   Our billing staff will not be able to assist you with questions regarding bills from these companies.  You will be contacted with the lab results as soon as they are available. The fastest way to get your results is to activate your My Chart account. Instructions are located on the last page of this paperwork. If you have not heard from us regarding the results in 2 weeks, please contact this office.     Abnormal Uterine Bleeding Abnormal uterine bleeding  means bleeding more than usual from your uterus. It can include:  Bleeding between periods.  Bleeding after sex.  Bleeding that is heavier than normal.  Periods that last longer than usual.  Bleeding after you have stopped having your period (menopause).  There are many problems that may cause this. You should see a doctor for any kind of bleeding that is not normal. Treatment depends on the cause of the bleeding. Follow these instructions at home:  Watch your condition for any changes.  Do not use tampons, douche, or have sex, if your doctor tells you not to.  Change your pads often.  Get regular well-woman exams. Make sure they include a pelvic exam and cervical cancer screening.  Keep all follow-up visits as told by your doctor. This is important. Contact a doctor if:  The bleeding lasts more than one week.  You feel dizzy at times.  You feel like you are going to throw up (nauseous).  You throw up. Get help right away if:  You pass out.  You have to change pads every hour.  You have belly (abdominal) pain.  You have a fever.  You get sweaty.  You get weak.  You passing large blood clots from your vagina. Summary  Abnormal uterine bleeding means bleeding more than usual from your uterus.  There are many problems that may cause this. You should see a doctor for any kind of bleeding that is not normal.  Treatment depends on the cause of the bleeding. This information is not intended to replace advice given to you by your health care provider. Make sure you discuss any questions you have with your health care provider. Document Released: 11/09/2008 Document Revised: 01/07/2016 Document Reviewed: 01/07/2016 Elsevier Interactive Patient Education  2017 Elsevier Inc.      Edwina BarthMiguel Tyriek Hofman, MD [facility]

## 2017-04-08 LAB — COMPREHENSIVE METABOLIC PANEL
ALBUMIN: 3.6 g/dL (ref 3.5–5.5)
ALT: 10 IU/L (ref 0–32)
AST: 19 IU/L (ref 0–40)
Albumin/Globulin Ratio: 1.6 (ref 1.2–2.2)
Alkaline Phosphatase: 50 IU/L (ref 39–117)
BUN / CREAT RATIO: 11 (ref 9–23)
BUN: 6 mg/dL (ref 6–20)
CO2: 22 mmol/L (ref 20–29)
CREATININE: 0.54 mg/dL — AB (ref 0.57–1.00)
Calcium: 8.5 mg/dL — ABNORMAL LOW (ref 8.7–10.2)
Chloride: 105 mmol/L (ref 96–106)
GFR, EST AFRICAN AMERICAN: 141 mL/min/{1.73_m2} (ref >59)
GFR, EST NON AFRICAN AMERICAN: 123 mL/min/{1.73_m2} (ref >59)
GLUCOSE: 102 mg/dL — AB (ref 65–99)
Globulin, Total: 2.2 g/dL (ref 1.5–4.5)
Potassium: 4.1 mmol/L (ref 3.5–5.2)
Sodium: 141 mmol/L (ref 134–144)
Total Protein: 5.8 g/dL — ABNORMAL LOW (ref 6.0–8.5)

## 2017-04-13 ENCOUNTER — Encounter: Payer: Self-pay | Admitting: *Deleted

## 2017-04-20 ENCOUNTER — Ambulatory Visit: Payer: Self-pay | Admitting: Gynecology

## 2017-07-26 ENCOUNTER — Ambulatory Visit (INDEPENDENT_AMBULATORY_CARE_PROVIDER_SITE_OTHER): Payer: Self-pay | Admitting: Family Medicine

## 2017-07-26 ENCOUNTER — Other Ambulatory Visit: Payer: Self-pay

## 2017-07-26 ENCOUNTER — Encounter: Payer: Self-pay | Admitting: Family Medicine

## 2017-07-26 VITALS — BP 112/82 | HR 118 | Temp 99.3°F | Resp 18 | Ht 62.5 in | Wt 125.0 lb

## 2017-07-26 DIAGNOSIS — J219 Acute bronchiolitis, unspecified: Secondary | ICD-10-CM

## 2017-07-26 DIAGNOSIS — Z720 Tobacco use: Secondary | ICD-10-CM

## 2017-07-26 DIAGNOSIS — Z716 Tobacco abuse counseling: Secondary | ICD-10-CM

## 2017-07-26 MED ORDER — ALBUTEROL SULFATE HFA 108 (90 BASE) MCG/ACT IN AERS: 2.0000 | INHALATION_SPRAY | Freq: Four times a day (QID) | RESPIRATORY_TRACT | 2 refills | Status: AC | PRN

## 2017-07-26 MED ORDER — NICOTINE 14 MG/24HR TD PT24: 14.0000 mg | MEDICATED_PATCH | Freq: Every day | TRANSDERMAL | 0 refills | Status: AC

## 2017-07-26 MED ORDER — PREDNISONE 20 MG PO TABS: 40.0000 mg | ORAL_TABLET | Freq: Every day | ORAL | 0 refills | Status: AC

## 2017-07-26 MED ORDER — AZITHROMYCIN 250 MG PO TABS: ORAL_TABLET | ORAL | 0 refills | Status: AC

## 2017-07-26 NOTE — Progress Notes (Signed)
Chief Complaint  Patient presents with  . Cough    x 8 days,  mucus is clear and white, some tinge of green the other day, coughing until chest hurts and feels like lead  in chest, night sweats, lightheadedness, trouble focusing .  Taking sudafed, robitussin w/ some relief, flonase and mucinex also    HPI Cough and wheeze  Pt reports that she has been having cold symptoms for the past 8 days She states that she has been having cough with clear mucus She states that she coughs until her chest hurts She was working a craft fair  She reports that she tried sudafed, robitussin and flonase with mucinex and vix cold symptoms She reports that she also has fatigue and lightheadedness She has nasal congestion with a runny nose.     Tobacco use She has been smoking since age 37 She reports that she is a smoker and smokes 10-12 cigs a day but is not able to smoke due to the cough. She reports that she has quit smoking with all 3 pregnancies.  She reports that she feels like she needs to quit smoking. She thinks this might be a good time now.    Past Medical History:  Diagnosis Date  . Chicken pox   . Depression   . Eating disorder   . Frequent headaches   . GERD (gastroesophageal reflux disease)   . History of fainting spells of unknown cause   . IBS (irritable bowel syndrome)   . IBS (irritable bowel syndrome)   . ICS (immotile cilia syndrome)   . Ovarian cyst   . SVT (supraventricular tachycardia) (HCC)   . SVT (supraventricular tachycardia) (HCC)   . UTI (lower urinary tract infection)     Current Outpatient Medications  Medication Sig Dispense Refill  . fluticasone (FLONASE ALLERGY RELIEF) 50 MCG/ACT nasal spray Place 1 spray into both nostrils daily.    Marland Kitchen. omeprazole (PRILOSEC OTC) 20 MG tablet Take 20 mg by mouth daily.     No current facility-administered medications for this visit.     Allergies:  Allergies  Allergen Reactions  . Clindamycin     Hives/ Swelling   .  Doxycycline Itching    Nausea and vomitting    No past surgical history on file.  Social History   Socioeconomic History  . Marital status: Divorced    Spouse name: Not on file  . Number of children: 3  . Years of education: 4714  . Highest education level: Not on file  Occupational History  . Occupation: Museum/gallery conservatorMarketing  Social Needs  . Financial resource strain: Not on file  . Food insecurity:    Worry: Not on file    Inability: Not on file  . Transportation needs:    Medical: Not on file    Non-medical: Not on file  Tobacco Use  . Smoking status: Current Every Day Smoker    Packs/day: 1.00    Years: 21.00    Pack years: 21.00    Types: Cigarettes  . Smokeless tobacco: Never Used  Substance and Sexual Activity  . Alcohol use: Yes    Alcohol/week: 3.6 oz    Types: 2 Glasses of wine, 2 Cans of beer, 2 Shots of liquor per week  . Drug use: Yes    Types: Marijuana  . Sexual activity: Yes    Birth control/protection: None, Condom  Lifestyle  . Physical activity:    Days per week: Not on file    Minutes  per session: Not on file  . Stress: Not on file  Relationships  . Social connections:    Talks on phone: Not on file    Gets together: Not on file    Attends religious service: Not on file    Active member of club or organization: Not on file    Attends meetings of clubs or organizations: Not on file    Relationship status: Not on file  Other Topics Concern  . Not on file  Social History Narrative   Fun: Clinical cytogeneticist, work, Sports administrator   Denies religious beliefs effecting health care   Denies abuse and feels safe at home.     Family History  Problem Relation Age of Onset  . Hyperlipidemia Mother   . Hypertension Mother   . Bipolar disorder Mother   . Hyperlipidemia Father   . Hypertension Father   . Arthritis Maternal Grandmother   . Arthritis Maternal Grandfather   . Arthritis Paternal Grandmother   . Arthritis Paternal Grandfather      ROS Review of  Systems See HPI Constitution: No fevers or chills No malaise No diaphoresis Skin: No rash or itching Eyes: no blurry vision, no double vision GU: no dysuria or hematuria Neuro: no dizziness or headaches all others reviewed and negative   Objective: Vitals:   07/26/17 1650  BP: 112/82  Pulse: (!) 118  Resp: 18  Temp: 99.3 F (37.4 C)  TempSrc: Oral  SpO2: 97%  Weight: 125 lb (56.7 kg)  Height: 5' 2.5" (1.588 m)    Physical Exam General: alert, oriented, in NAD Head: normocephalic, atraumatic, no sinus tenderness Eyes: EOM intact, no scleral icterus or conjunctival injection Ears: TM clear bilaterally Nose: mucosa nonerythematous, nonedematous Throat: no pharyngeal exudate or erythema Lymph: no posterior auricular, submental or cervical lymph adenopathy Heart: normal rate, normal sinus rhythm, no murmurs Lungs: + wheezing diffusely   Assessment and Plan Kanda was seen today for cough.  Diagnoses and all orders for this visit:  Acute bronchiolitis with bronchospasm -  zpak and prednisone as well as albuterol advised Continue flonase and mucinex and stop the other meds otc The stimulant in the cough meds is raising the pulse   Encounter for smoking cessation counseling Tobacco abuse-  Discussed biochemical addiction to nicotine Also discussed that there is also a hand-to-mouth component Discussed ways to quit smoking including Chantix, Zyban, Zoloft and Nicotine replacement options Discuss behavioral modifications and non-pharmacologic options like acupuncture and hypnosis Discussed smoking cessation hotlines and support groups Pt is in contemplation stage of change        Vineet Kinney A Creta Levin

## 2017-07-26 NOTE — Patient Instructions (Addendum)
Pick a day to stop using tobacco. That day start using the 14mg  patch.  Use one of the 14-mg patches once a day for 2 weeks. On week 3, start using the 7-mg patch once a day for 2 weeks. On week 5, stop the patches and use minty gum or chew sticks.     IF you received an x-ray today, you will receive an invoice from [facility]. Please contact [facility] at [phone] with questions or concerns regarding your invoice.   IF you received labwork today, you will receive an invoice from [facility]. Please contact [facility] at [phone] with questions or concerns regarding your invoice.   Our billing staff will not be able to assist you with questions regarding bills from these companies.  You will be contacted with the lab results as soon as they are available. The fastest way to get your results is to activate your My Chart account. Instructions are located on the last page of this paperwork. If you have not heard from us regarding the results in 2 weeks, please contact this office.     Bronchospasm, Adult Bronchospasm is a tightening of the airways going into the lungs. During an episode, it may be harder to breathe. You may cough, and you may make a whistling sound when you breathe (wheeze). This condition often affects people with asthma. What are the causes? This condition is caused by swelling and irritation in the airways. It can be triggered by:  An infection (common).  Seasonal allergies.  An allergic reaction.  Exercise.  Irritants. These include pollution, cigarette smoke, strong odors, aerosol sprays, and paint fumes.  Weather changes. Winds increase molds and pollens in the air. Cold air may cause swelling.  Stress and emotional upset.  What are the signs or symptoms? Symptoms of this condition include:  Wheezing. If the episode was triggered by an allergy, wheezing may start right away or hours later.  Nighttime coughing.  Frequent or severe  coughing with a simple cold.  Chest tightness.  Shortness of breath.  Decreased ability to exercise.  How is this diagnosed? This condition is usually diagnosed with a review of your medical history and a physical exam. Tests, such as lung function tests, are sometimes done to look for other conditions. The need for a chest X-ray depends on where the wheezing occurs and whether it is the first time you have wheezed. How is this treated? This condition may be treated with:  Inhaled medicines. These open up the airways and help you breathe. They can be taken with an inhaler or a nebulizer device.  Corticosteroid medicines. These may be given for severe bronchospasm, usually when it is associated with asthma.  Avoiding triggers, such as irritants, infection, or allergies.  Follow these instructions at home: Medicines  Take over-the-counter and prescription medicines only as told by your health care provider.  If you need to use an inhaler or nebulizer to take your medicine, ask your health care provider to explain how to use it correctly. If you were given a spacer, always use it with your inhaler. Lifestyle  Reduce the number of triggers in your home. To do this: ? Change your heating and air conditioning filter at least once a month. ? Limit your use of fireplaces and wood stoves. ? Do not smoke. Do not allow smoking in your home. ? Avoid using perfumes and fragrances. ? Get rid of pests, such as roaches and mice, and their droppings. ? Remove any mold  from your home. ? Keep your house clean and dust free. Use unscented cleaning products. ? Replace carpet with wood, tile, or vinyl flooring. Carpet can trap dander and dust. ? Use allergy-proof pillows, mattress covers, and box spring covers. ? Wash bed sheets and blankets every week in hot water. Dry them in a dryer. ? Use blankets that are made of polyester or cotton. ? Wash your hands often. ? Do not allow pets in your  bedroom.  Avoid breathing in cold air when you exercise. General instructions  Have a plan for seeking medical care. Know when to call your health care provider and local emergency services, and where to get emergency care.  Stay up to date on your immunizations.  When you have an episode of bronchospasm, stay calm. Try to relax and breathe more slowly.  If you have asthma, make sure you have an asthma action plan.  Keep all follow-up visits as told by your health care provider. This is important. Contact a health care provider if:  You have muscle aches.  You have chest pain.  The mucus that you cough up (sputum) changes from clear or white to yellow, green, gray, or bloody.  You have a fever.  Your sputum gets thicker. Get help right away if:  Your wheezing and coughing get worse, even after you take your prescribed medicines.  It gets even harder to breathe.  You develop severe chest pain. Summary  Bronchospasm is a tightening of the airways going into the lungs.  During an episode of bronchospasm, you may have a harder time breathing. You may cough and make a whistling sound when you breathe (wheeze).  Avoid exposure to triggers such as smoke, dust, mold, animal dander, and fragrances.  When you have an episode of bronchospasm, stay calm. Try to relax and breathe more slowly. This information is not intended to replace advice given to you by your health care provider. Make sure you discuss any questions you have with your health care provider. Document Released: 01/15/2003 Document Revised: 01/09/2016 Document Reviewed: 01/09/2016 Elsevier Interactive Patient Education  2017 [employer].  Steps to Quit Smoking Smoking tobacco can be harmful to your health and can affect almost every organ in your body. Smoking puts you, and those around you, at risk for developing many serious chronic diseases. Quitting smoking is difficult, but it is one of the best things that  you can do for your health. It is never too late to quit. What are the benefits of quitting smoking? When you quit smoking, you lower your risk of developing serious diseases and conditions, such as:  Lung cancer or lung disease, such as COPD.  Heart disease.  Stroke.  Heart attack.  Infertility.  Osteoporosis and bone fractures.  Additionally, symptoms such as coughing, wheezing, and shortness of breath may get better when you quit. You may also find that you get sick less often because your body is stronger at fighting off colds and infections. If you are pregnant, quitting smoking can help to reduce your chances of having a baby of low birth weight. How do I get ready to quit? When you decide to quit smoking, create a plan to make sure that you are successful. Before you quit:  Pick a date to quit. Set a date within the next two weeks to give you time to prepare.  Write down the reasons why you are quitting. Keep this list in places where you will see it often, such as on  your bathroom mirror or in your car or wallet.  Identify the people, places, things, and activities that make you want to smoke (triggers) and avoid them. Make sure to take these actions: ? Throw away all cigarettes at home, at work, and in your car. ? Throw away smoking accessories, such as Set designer. ? Clean your car and make sure to empty the ashtray. ? Clean your home, including curtains and carpets.  Tell your family, friends, and coworkers that you are quitting. Support from your loved ones can make quitting easier.  Talk with your health care provider about your options for quitting smoking.  Find out what treatment options are covered by your health insurance.  What strategies can I use to quit smoking? Talk with your healthcare provider about different strategies to quit smoking. Some strategies include:  Quitting smoking altogether instead of gradually lessening how much you smoke over  a period of time. Research shows that quitting "cold [place]" is more successful than gradually quitting.  Attending in-person counseling to help you build problem-solving skills. You are more likely to have success in quitting if you attend several counseling sessions. Even short sessions of 10 minutes can be effective.  Finding resources and support systems that can help you to quit smoking and remain smoke-free after you quit. These resources are most helpful when you use them often. They can include: ? Online chats with a Veterinary surgeon. ? Telephone quitlines. ? Automotive engineer. ? Support groups or group counseling. ? Text messaging programs. ? Mobile phone applications.  Taking medicines to help you quit smoking. (If you are pregnant or breastfeeding, talk with your health care provider first.) Some medicines contain nicotine and some do not. Both types of medicines help with cravings, but the medicines that include nicotine help to relieve withdrawal symptoms. Your health care provider may recommend: ? Nicotine patches, gum, or lozenges. ? Nicotine inhalers or sprays. ? Non-nicotine medicine that is taken by mouth.  Talk with your health care provider about combining strategies, such as taking medicines while you are also receiving in-person counseling. Using these two strategies together makes you more likely to succeed in quitting than if you used either strategy on its own. If you are pregnant or breastfeeding, talk with your health care provider about finding counseling or other support strategies to quit smoking. Do not take medicine to help you quit smoking unless told to do so by your health care provider. What things can I do to make it easier to quit? Quitting smoking might feel overwhelming at first, but there is a lot that you can do to make it easier. Take these important actions:  Reach out to your family and friends and ask that they support and encourage you during  this time. Call telephone quitlines, reach out to support groups, or work with a counselor for support.  Ask people who smoke to avoid smoking around you.  Avoid places that trigger you to smoke, such as bars, parties, or smoke-break areas at work.  Spend time around people who do not smoke.  Lessen stress in your life, because stress can be a smoking trigger for some people. To lessen stress, try: ? Exercising regularly. ? Deep-breathing exercises. ? Yoga. ? Meditating. ? Performing a body scan. This involves closing your eyes, scanning your body from head to toe, and noticing which parts of your body are particularly tense. Purposefully relax the muscles in those areas.  Download or purchase mobile phone or tablet  apps (applications) that can help you stick to your quit plan by providing reminders, tips, and encouragement. There are many free apps, such as QuitGuide from the [employer] developer for Disease Control and Prevention). You can find other support for quitting smoking (smoking cessation) through smokefree.gov and other websites.  How will I feel when I quit smoking? Within the first 24 hours of quitting smoking, you may start to feel some withdrawal symptoms. These symptoms are usually most noticeable 2-3 days after quitting, but they usually do not last beyond 2-3 weeks. Changes or symptoms that you might experience include:  Mood swings.  Restlessness, anxiety, or irritation.  Difficulty concentrating.  Dizziness.  Strong cravings for sugary foods in addition to nicotine.  Mild weight gain.  Constipation.  Nausea.  Coughing or a sore throat.  Changes in how your medicines work in your body.  A depressed mood.  Difficulty sleeping (insomnia).  After the first 2-3 weeks of quitting, you may start to notice more positive results, such as:  Improved sense of smell and taste.  Decreased coughing and sore throat.  Slower heart rate.  Lower blood  pressure.  Clearer skin.  The ability to breathe more easily.  Fewer sick days.  Quitting smoking is very challenging for most people. Do not get discouraged if you are not successful the first time. Some people need to make many attempts to quit before they achieve long-term success. Do your best to stick to your quit plan, and talk with your health care provider if you have any questions or concerns. This information is not intended to replace advice given to you by your health care provider. Make sure you discuss any questions you have with your health care provider. Document Released: 01/06/2001 Document Revised: 09/10/2015 Document Reviewed: 05/29/2014 Elsevier Interactive Patient Education  [employer].

## 2018-02-27 IMAGING — CT CT ABD-PELV W/ CM
2 of 4 series · 16 of 46 positions shown, 18 images · IV contrast (iopamidol)
Comparison: CT Abdomen and Pelvis 07/18/2014

CLINICAL DATA: 35-year-old female with left abdominal pain for
weeks. Left lower quadrant and back pain.

EXAM:
CT ABDOMEN AND PELVIS WITH CONTRAST
TECHNIQUE: Multidetector CT imaging of the abdomen and pelvis was performed
using the standard protocol following bolus administration of
intravenous contrast.
CONTRAST:  100mL ZA9FA1-USS IOPAMIDOL (ZA9FA1-USS) INJECTION 61%

[Series 2: abd/pel with · axial · 0.73mm/px · z∈[+997,+1367]mm · 13 of 82 slices shown, 15 images]
[im 4/82  soft-tissue]
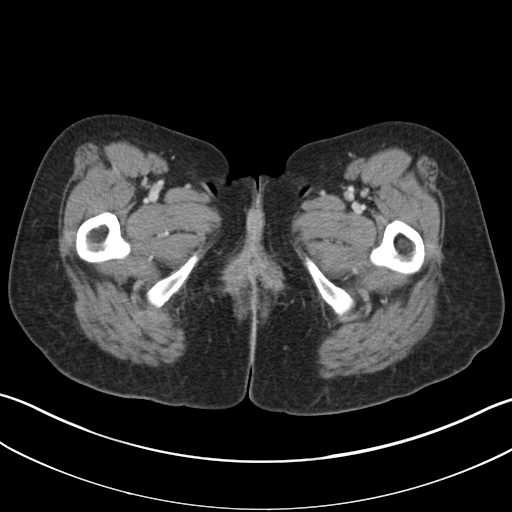
[im 4/82  bone]
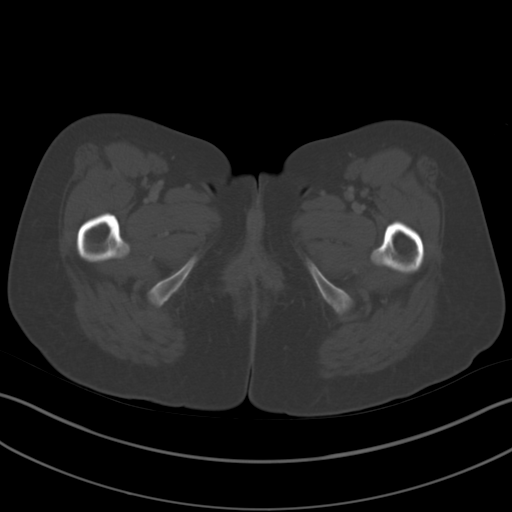
[im 12/82  soft-tissue]
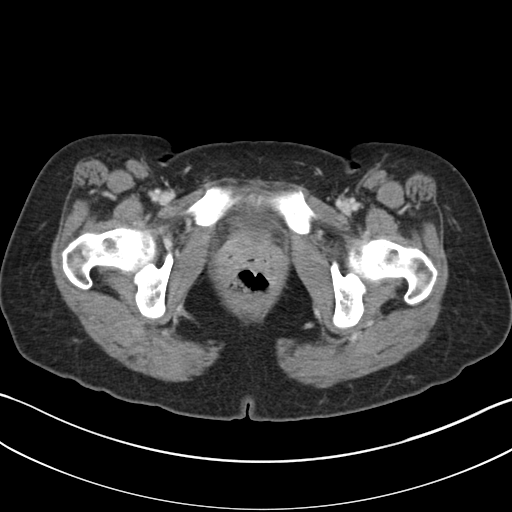
[im 16/82  soft-tissue]
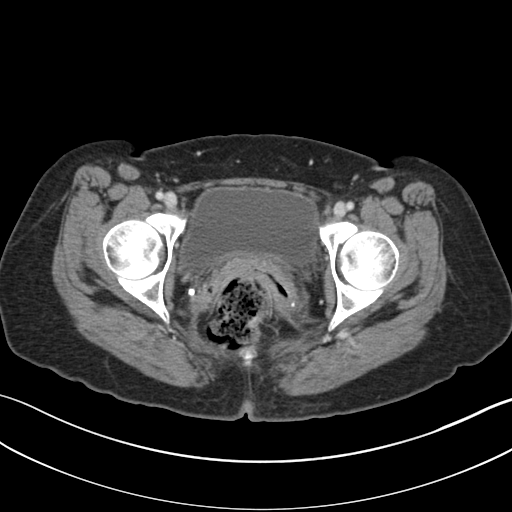
[im 24/82  soft-tissue]
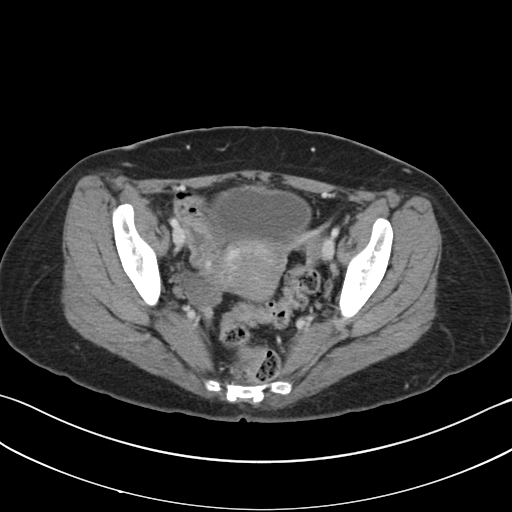
[im 28/82  soft-tissue]
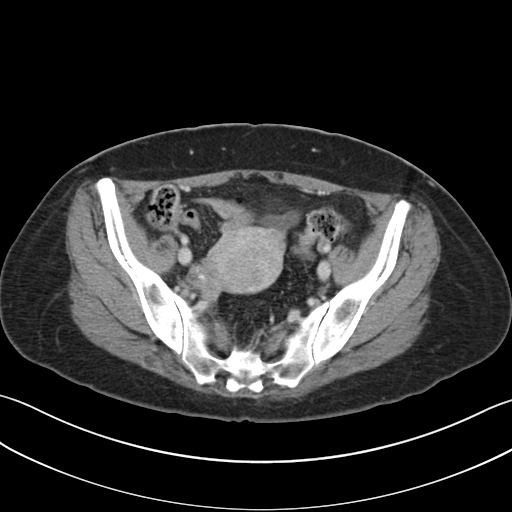
[im 35/82  soft-tissue]
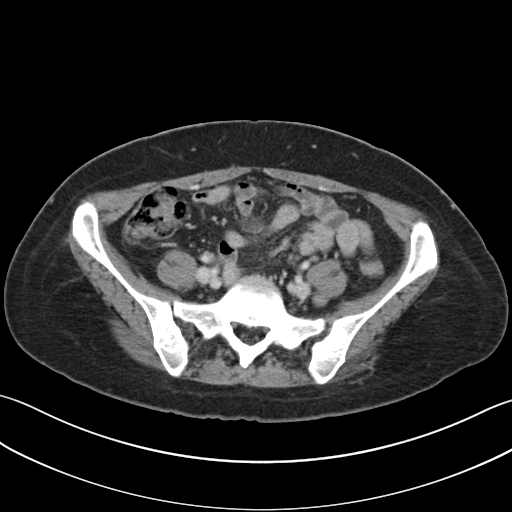
[im 43/82  soft-tissue]
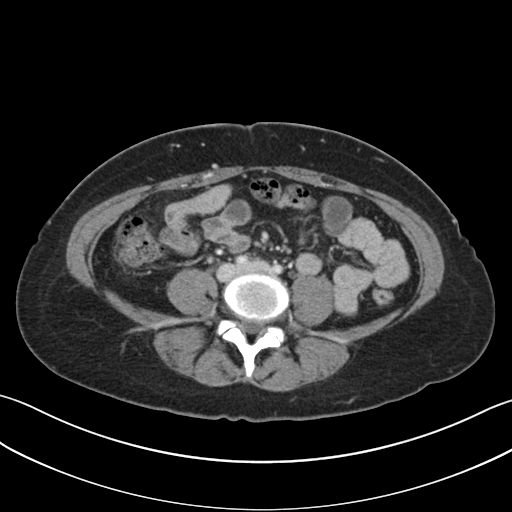
[im 47/82  soft-tissue]
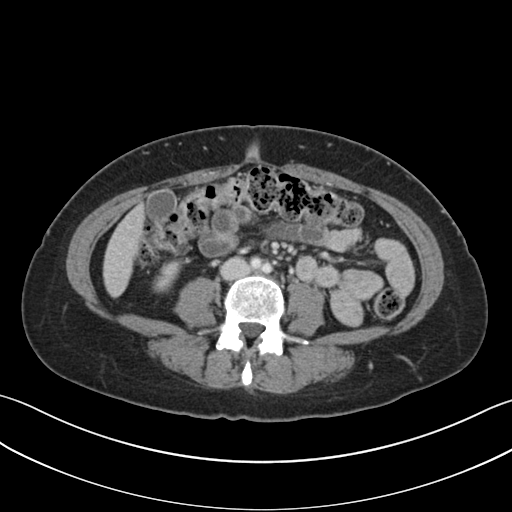
[im 55/82  soft-tissue]
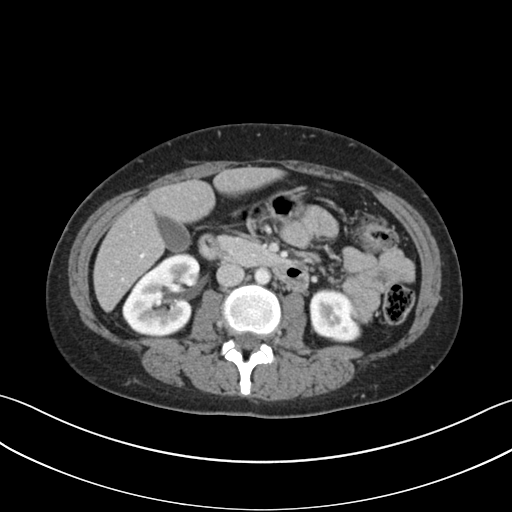
[im 55/82  bone]
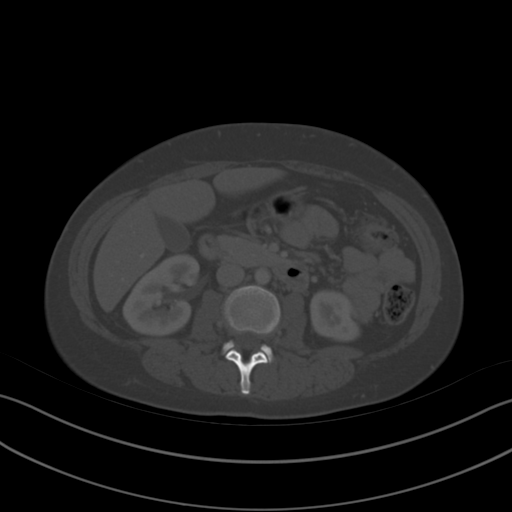
[im 58/82  soft-tissue]
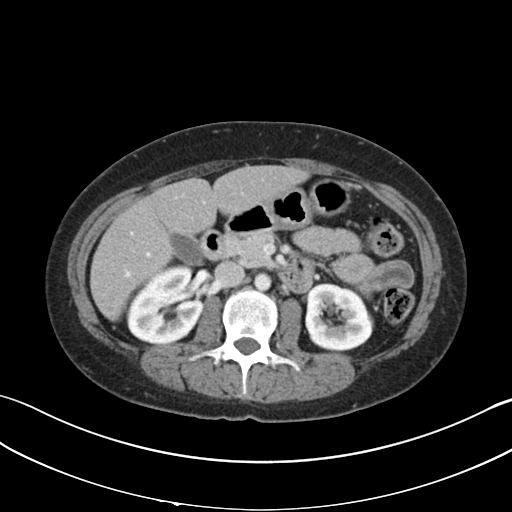
[im 66/82  soft-tissue]
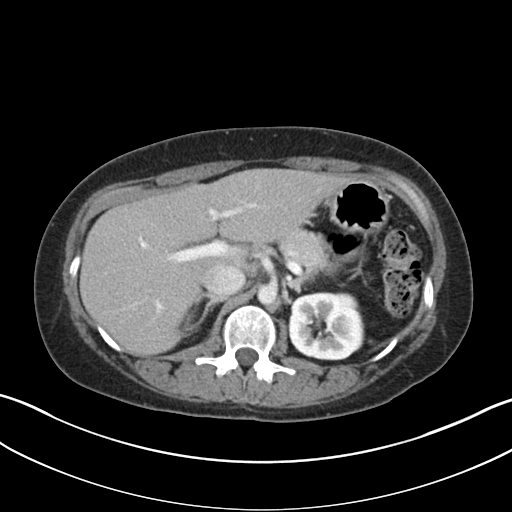
[im 70/82  soft-tissue]
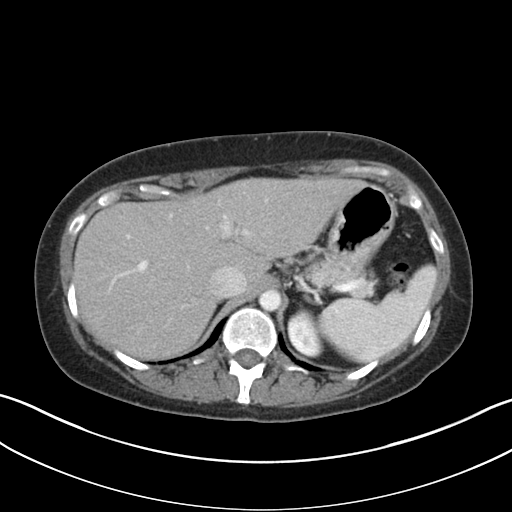
[im 78/82  soft-tissue]
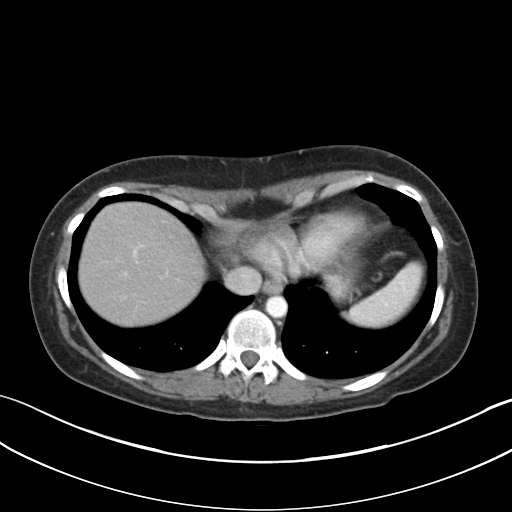

[Series 5: coronal a/|p · coronal · 0.72mm/px · 3 of 117 slices shown]
[im 39/117  soft-tissue]
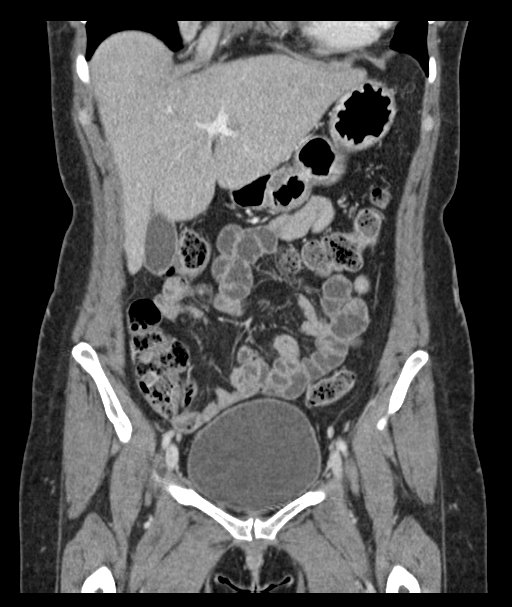
[im 52/117  soft-tissue]
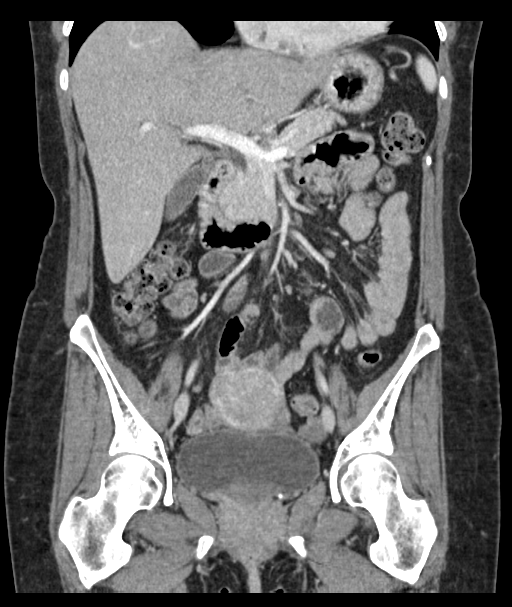
[im 65/117  soft-tissue]
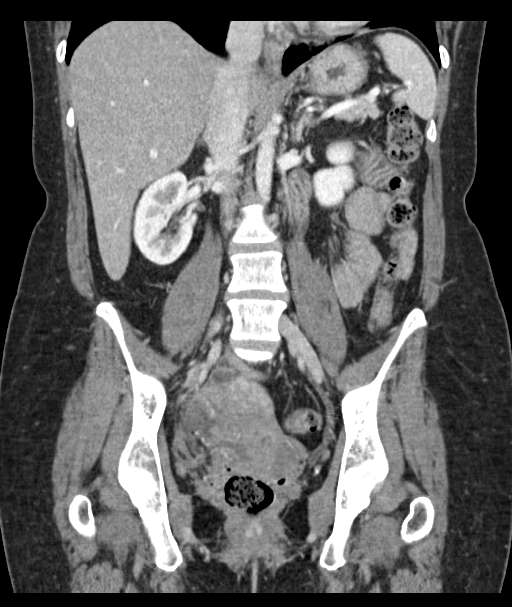

[16 of 46 positions shown; findings below may reference images not displayed]

FINDINGS: Lower chest: Pectus excavatum re- demonstrated. Mild lung base
atelectasis. No pericardial or pleural effusion.

Hepatobiliary: Negative liver and gallbladder.

Pancreas: Negative.

Spleen: Negative.

Adrenals/Urinary Tract: Normal adrenal glands. The kidneys appear
stable and normal. Normal bilateral renal enhancement. No
perinephric stranding. No hydronephrosis or hydroureter.
Unremarkable urinary bladder.

No abdominal free fluid.

Stomach/Bowel: Retained stool in the rectum. Negative sigmoid colon.
Negative left colon. Retained stool in the transverse colon and at
both flexures. Mild retained stool in the right colon. Negative
appendix. Negative terminal ileum. No dilated small bowel. Negative
stomach and duodenum.

Vascular/Lymphatic: Major arterial structures in the abdomen and
pelvis are normal. Portal venous system appears patent.

No lymphadenopathy.  Chronic pelvic phleboliths are stable.

Reproductive: IUD no longer in place. Uterus and adnexa within
normal limits; left ovary appears normal and there is an enhancing
probable physiologic right ovarian cyst (series 2, image 57).

Other: No definite pelvic free fluid.

Musculoskeletal: Slight levoconvex spinal curvature. Stable
visualized osseous structures. No acute osseous abnormality
identified.
IMPRESSION: 1. Negative CT Abdomen and Pelvis.
2. Pectus excavatum.

## 2018-10-18 ENCOUNTER — Encounter: Payer: Self-pay | Admitting: Gynecology
